# Patient Record
Sex: Male | Born: 1940
Health system: Southern US, Community
[De-identification: ages and names within clinical notes are randomized; demographics above are authoritative.]

## PROBLEM LIST (undated history)

## (undated) DIAGNOSIS — I10 Essential (primary) hypertension: Secondary | ICD-10-CM

## (undated) DIAGNOSIS — E785 Hyperlipidemia, unspecified: Secondary | ICD-10-CM

## (undated) DIAGNOSIS — J449 Chronic obstructive pulmonary disease, unspecified: Secondary | ICD-10-CM

## (undated) DIAGNOSIS — M25569 Pain in unspecified knee: Secondary | ICD-10-CM

## (undated) HISTORY — DX: Chronic obstructive pulmonary disease, unspecified: J44.9

## (undated) HISTORY — DX: Hyperlipidemia, unspecified: E78.5

## (undated) HISTORY — DX: Pain in unspecified knee: M25.569

## (undated) HISTORY — PX: NO PAST SURGERIES: SHX2092

## (undated) HISTORY — DX: Essential (primary) hypertension: I10

---

## 2008-12-15 ENCOUNTER — Emergency Department: Payer: Self-pay | Admitting: Emergency Medicine

## 2011-08-09 ENCOUNTER — Ambulatory Visit: Payer: Self-pay

## 2011-08-09 ENCOUNTER — Emergency Department: Payer: Self-pay | Admitting: Emergency Medicine

## 2014-01-13 DIAGNOSIS — M7580 Other shoulder lesions, unspecified shoulder: Secondary | ICD-10-CM | POA: Insufficient documentation

## 2015-01-03 DIAGNOSIS — I10 Essential (primary) hypertension: Secondary | ICD-10-CM | POA: Insufficient documentation

## 2015-01-03 DIAGNOSIS — M170 Bilateral primary osteoarthritis of knee: Secondary | ICD-10-CM | POA: Insufficient documentation

## 2015-08-22 DIAGNOSIS — E1169 Type 2 diabetes mellitus with other specified complication: Secondary | ICD-10-CM | POA: Insufficient documentation

## 2015-08-22 DIAGNOSIS — N400 Enlarged prostate without lower urinary tract symptoms: Secondary | ICD-10-CM | POA: Insufficient documentation

## 2015-08-22 DIAGNOSIS — I2510 Atherosclerotic heart disease of native coronary artery without angina pectoris: Secondary | ICD-10-CM | POA: Insufficient documentation

## 2019-09-29 LAB — HEMOGLOBIN A1C: Hemoglobin A1C: 6.9

## 2020-02-10 ENCOUNTER — Other Ambulatory Visit: Payer: Self-pay | Admitting: Family Medicine

## 2020-02-10 ENCOUNTER — Ambulatory Visit (INDEPENDENT_AMBULATORY_CARE_PROVIDER_SITE_OTHER): Payer: Medicare Other | Admitting: Family Medicine

## 2020-02-10 ENCOUNTER — Other Ambulatory Visit: Payer: Self-pay

## 2020-02-10 ENCOUNTER — Encounter: Payer: Self-pay | Admitting: Family Medicine

## 2020-02-10 VITALS — BP 121/68 | HR 68 | Temp 96.9°F | Resp 16 | Ht 69.0 in | Wt 137.6 lb

## 2020-02-10 DIAGNOSIS — I10 Essential (primary) hypertension: Secondary | ICD-10-CM

## 2020-02-10 DIAGNOSIS — E785 Hyperlipidemia, unspecified: Secondary | ICD-10-CM

## 2020-02-10 DIAGNOSIS — E1169 Type 2 diabetes mellitus with other specified complication: Secondary | ICD-10-CM | POA: Insufficient documentation

## 2020-02-10 DIAGNOSIS — N400 Enlarged prostate without lower urinary tract symptoms: Secondary | ICD-10-CM

## 2020-02-10 DIAGNOSIS — R351 Nocturia: Secondary | ICD-10-CM

## 2020-02-10 DIAGNOSIS — J432 Centrilobular emphysema: Secondary | ICD-10-CM

## 2020-02-10 NOTE — Patient Instructions (Addendum)
Thank you for coming to the office today.  Call if running low or out of medicine before you come back and I can re order it.  We can discuss other med changes next time. But I am good with your sugars. Keep checking it twice day.  May use Symbicort 2 puff twice a day MORE OFTEN if you like to help with the breathing. If we decide to change, we can try a different inhaler  When you go to [facility] for Diabetic Eye Exam - give them our card and ask them to send [place] a copy of the Diabetic eye report.   DUE for FASTING BLOOD WORK (no food or drink after midnight before the lab appointment, only water or coffee without cream/sugar on the morning of)  SCHEDULE "Lab Only" visit in the morning at the clinic for lab draw in 2 MONTHS   - Make sure Lab Only appointment is at about 1 week before your next appointment, so that results will be available  For Lab Results, once available within 2-3 days of blood draw, you can can log in to MyChart online to view your results and a brief explanation. Also, we can discuss results at next follow-up visit.   Please schedule a Follow-up Appointment to: Return in about 2 months (around 04/11/2020) for 2 month follow-up DM, HTN, COPD, Lab results.  If you have any other questions or concerns, please feel free to call the office or send a message through [place]. You may also schedule an earlier appointment if necessary.  Additionally, you may be receiving a survey about your experience at our office within a few days to 1 week by e-mail or mail. We value your feedback.  Nobie Putnam, DO [place]

## 2020-02-10 NOTE — Progress Notes (Signed)
Subjective:    Patient ID: Stephen Savage, male    DOB: 03-05-1941, 79 y.o.   MRN: [id number]  Stephen Savage is a 79 y.o. male presenting on 02/10/2020 for Establish Care (Diabetes, HTN, COPD)  Here to establish care as new patient.  HPI   Centrilobular Emphysema Tobacco Abuse Chronic problem, active smoker 1ppd >60 years. Not ready to quit. He has some occasional breathing problem dyspnea or cough. Takes Symbicort fairly often but not every day, uses PRn sometimes. Not using albuterol rescue. Not interested in other inhaler currently  CHRONIC HTN: Reports no concerns, has controlled BP Current Meds - Quinapril 40mg  daily, Amlodipine 10mg  daily,    Reports good compliance, took meds today. Tolerating well, w/o complaints. Denies CP, dyspnea, HA, edema, dizziness / lightheadedness  CHRONIC DM, Type 2: Reports no concerns. Previous A1c 6.9 (09/29/19) He is concerned about losing weight further on any other DM medicine prior PCP discussed other med CBGs: Avg 140 in AM and 250 in PM, Low 100, High 300. Checks CBGs twice daily Meds: Metformin XR 500mg  x 2 = 1000mg  BID Reports good compliance. Tolerating well w/o side-effects Currently on ACEi Lifestyle: - Diet (Improved)  - Exercise (Active) Upcoming [facility] DM exam July 2021, they will request record to be sent Denies hypoglycemia, polyuria, visual changes, numbness or tingling.  Bilateral Knee Pain chronic, Osteoarthritis Followed by [facility] for arthritis, knee pain, has had injections cortisone and synvisc type injections as well in past. - He has chronic wear and tear, often working outdoors and often on his knees at times - He takes Tylenol as needed - Not using topical - He functions well overall still   Health Maintenance: UTD PNA and COVID vaccines.  Depression screen PHQ 2/9 02/10/2020  Decreased Interest 0  Down, Depressed, Hopeless 0  PHQ - 2 Score 0    Past Medical History:    Diagnosis Date  . COPD (chronic obstructive pulmonary disease) ([place])   . Hyperlipidemia   . Hypertension   . Knee pain    History reviewed. No pertinent surgical history. Social History   Socioeconomic History  . Marital status: Married    Spouse name: Not on file  . Number of children: Not on file  . Years of education: Not on file  . Highest education level: Not on file  Occupational History  . Not on file  Tobacco Use  . Smoking status: Current Every Day Smoker    Packs/day: 1.00    Years: 60.00    Pack years: 60.00    Types: Cigarettes  . Smokeless tobacco: Never Used  Substance and Sexual Activity  . Alcohol use: Not Currently  . Drug use: Never  . Sexual activity: Not on file  Other Topics Concern  . Not on file  Social History Narrative  . Not on file   Social Determinants of Health   Financial Resource Strain:   . Difficulty of Paying Living Expenses:   Food Insecurity:   . Worried About Charity fundraiser in the Last Year:   . Arboriculturist in the Last Year:   Transportation Needs:   . Film/video editor (Medical):   Marland Kitchen Lack of Transportation (Non-Medical):   Physical Activity:   . Days of Exercise per Week:   . Minutes of Exercise per Session:   Stress:   . Feeling of Stress :   Social Connections:   . Frequency of Communication with Friends and  Family:   . Frequency of Social Gatherings with Friends and Family:   . Attends Religious Services:   . Active Member of Clubs or Organizations:   . Attends Archivist Meetings:   Marland Kitchen Marital Status:   Intimate Partner Violence:   . Fear of Current or Ex-Partner:   . Emotionally Abused:   Marland Kitchen Physically Abused:   . Sexually Abused:    Family History  Problem Relation Age of Onset  . Heart disease Mother 8  . Heart attack Mother   . Diabetes Father 28   Current Outpatient Medications on File Prior to Visit  Medication Sig  . amLODipine (NORVASC) 10 MG tablet Take 10 mg by mouth  daily.  Marland Kitchen aspirin EC 81 MG tablet Take 81 mg by mouth daily. Swallow whole.  . metFORMIN (GLUCOPHAGE) 500 MG tablet Take by mouth 2 (two) times daily with a meal.  . omeprazole (PRILOSEC) 20 MG capsule Take 20 mg by mouth daily.  . quinapril (ACCUPRIL) 40 MG tablet Take 40 mg by mouth daily.  . simvastatin (ZOCOR) 20 MG tablet Take 20 mg by mouth at bedtime.  . SYMBICORT 80-4.5 MCG/ACT inhaler Inhale 2 puffs into the lungs 2 (two) times daily.   No current facility-administered medications on file prior to visit.    Review of Systems Per HPI unless specifically indicated above     Objective:    BP 121/68   Pulse 68   Temp (!) 96.9 F (36.1 C) (Temporal)   Resp 16   Ht 5\' 9"  (1.753 m)   Wt 137 lb 9.6 oz (62.4 kg)   SpO2 96%   BMI 20.32 kg/m   Wt Readings from Last 3 Encounters:  02/10/20 137 lb 9.6 oz (62.4 kg)    Physical Exam Vitals and nursing note reviewed.  Constitutional:      General: He is not in acute distress.    Appearance: He is well-developed. He is not diaphoretic.     Comments: Well-appearing elderly 79 year old, thin, comfortable, cooperative  HENT:     Head: Normocephalic and atraumatic.  Eyes:     General:        Right eye: No discharge.        Left eye: No discharge.     Conjunctiva/sclera: Conjunctivae normal.  Neck:     Thyroid: No thyromegaly.  Cardiovascular:     Rate and Rhythm: Normal rate and regular rhythm.     Heart sounds: Normal heart sounds. No murmur heard.   Pulmonary:     Effort: Pulmonary effort is normal. No respiratory distress.     Breath sounds: Wheezing (mild end exp wheezes scattered) present. No rales.     Comments: Speaks full sentences Musculoskeletal:        General: Normal range of motion.     Cervical back: Normal range of motion and neck supple.  Lymphadenopathy:     Cervical: No cervical adenopathy.  Skin:    General: Skin is warm and dry.     Findings: No erythema or rash.  Neurological:     Mental Status:  He is alert and oriented to person, place, and time.  Psychiatric:        Behavior: Behavior normal.     Comments: Well groomed, good eye contact, normal speech and thoughts      Diabetic Foot Exam - Simple   Simple Foot Form Diabetic Foot exam was performed with the following findings: Yes 02/10/2020 10:42 AM  Visual  Inspection See comments: Yes Sensation Testing Intact to touch and monofilament testing bilaterally: Yes Pulse Check Posterior Tibialis and Dorsalis pulse intact bilaterally: Yes Comments Mild dry skin with some early callus formation heel and forefoot. No ulceration. Intact monofilament.     Results for orders placed or performed in visit on 02/10/20  Hemoglobin A1c  Result Value Ref Range   Hemoglobin A1C 6.9       Assessment & Plan:   Problem List Items Addressed This Visit    Type 2 diabetes mellitus with other specified complication ([place]) - Primary    Well-controlled DM with A1c 6.9 (previously 09/2019) Home readings reviewed overall average seems within 7-8 E2C range Complications - hyperlipidemia, coronary artery disease- increases risk of future cardiovascular complications   Plan:  1. Continue current therapy - metformin XR 500mg  x 2 = 1000mg  BID 2. Encourage improved lifestyle - low carb, low sugar diet, reduce portion size, continue improving regular exercise 3. Check CBG , bring log to next visit for review 4. Continue ASA, ACEi, Statin 5. DM Foot exam done today / Advised to schedule DM ophtho exam, send record - [place] 6. Follow-up 2 months labs  Future consider adjust therapy - may benefit from GLP1 oral or other med, however he concern about weight loss and may lose more weight on those other meds likely no change      Relevant Medications   quinapril (ACCUPRIL) 40 MG tablet   simvastatin (ZOCOR) 20 MG tablet   metFORMIN (GLUCOPHAGE) 500 MG tablet   aspirin EC 81 MG tablet   Essential hypertension    Well-controlled HTN - Home BP  readings  No known complications     Plan:  1. Continue current BP regimen Quinapril 40mg  daily, Amlodipine 10mg  daily 2. Encourage improved lifestyle - low sodium diet, regular exercise 3. Continue monitor BP outside office, bring readings to next visit, if persistently >140/90 or new symptoms notify office sooner      Relevant Medications   quinapril (ACCUPRIL) 40 MG tablet   amLODipine (NORVASC) 10 MG tablet   simvastatin (ZOCOR) 20 MG tablet   aspirin EC 81 MG tablet   Centrilobular emphysema (HCC)    Chronic Emphysema COPD Stable without flare Has some mild persistent breakthrough symptoms, seems suboptimal control on current intermittent use of maintenance Symbicort Reviewed proper dosing 2 puff BID, offer other option if interested possibly reduced frequency dose of other therapy Offer albuterol PRN Tobacco cessation Follow-up if not improving      Relevant Medications   SYMBICORT 80-4.5 MCG/ACT inhaler      Review outside records prior PCP [facility]  No orders of the defined types were placed in this encounter.   Follow up plan: Return in about 2 months (around 04/11/2020) for 2 month follow-up DM, HTN, COPD, Lab results.   Future orders - 2 months full panel A1c, LIPID CBC CMET PSA  Nobie Putnam, DO [facility] 02/10/2020, 10:29 AM

## 2020-02-10 NOTE — Assessment & Plan Note (Addendum)
Well-controlled HTN - Home BP readings  No known complications    Plan:  1. Continue current BP regimen Quinapril 40mg daily, Amlodipine 10mg daily 2. Encourage improved lifestyle - low sodium diet, regular exercise 3. Continue monitor BP outside office, bring readings to next visit, if persistently >140/90 or new symptoms notify office sooner 

## 2020-02-10 NOTE — Assessment & Plan Note (Addendum)
Well-controlled DM with A1c 6.9 (previously 09/2019) Home readings reviewed overall average seems within 7-8 Q2M range Complications - hyperlipidemia, coronary artery disease- increases risk of future cardiovascular complications   Plan:  1. Continue current therapy - metformin XR 500mg  x 2 = 1000mg  BID 2. Encourage improved lifestyle - low carb, low sugar diet, reduce portion size, continue improving regular exercise 3. Check CBG , bring log to next visit for review 4. Continue ASA, ACEi, Statin 5. DM Foot exam done today / Advised to schedule DM ophtho exam, send record - [place] 6. Follow-up 2 months labs  Future consider adjust therapy - may benefit from GLP1 oral or other med, however he concern about weight loss and may lose more weight on those other meds likely no change

## 2020-02-10 NOTE — Assessment & Plan Note (Addendum)
Chronic Emphysema COPD Stable without flare Has some mild persistent breakthrough symptoms, seems suboptimal control on current intermittent use of maintenance Symbicort Reviewed proper dosing 2 puff BID, offer other option if interested possibly reduced frequency dose of other therapy Offer albuterol PRN Tobacco cessation Follow-up if not improving 

## 2020-03-05 ENCOUNTER — Ambulatory Visit
Admission: RE | Admit: 2020-03-05 | Discharge: 2020-03-05 | Disposition: A | Discharge status: Home / Self Care | Payer: Medicare Other | Source: Ambulatory Visit | Attending: Family Medicine | Admitting: Family Medicine

## 2020-03-05 ENCOUNTER — Ambulatory Visit (INDEPENDENT_AMBULATORY_CARE_PROVIDER_SITE_OTHER): Payer: Medicare Other | Admitting: Family Medicine

## 2020-03-05 ENCOUNTER — Other Ambulatory Visit: Payer: Self-pay

## 2020-03-05 ENCOUNTER — Ambulatory Visit
Admission: RE | Admit: 2020-03-05 | Discharge: 2020-03-05 | Disposition: A | Discharge status: Home / Self Care | Payer: Medicare Other | Attending: Family Medicine | Admitting: Family Medicine

## 2020-03-05 ENCOUNTER — Encounter: Payer: Self-pay | Admitting: Family Medicine

## 2020-03-05 VITALS — BP 127/71 | HR 86 | Temp 97.5°F | Resp 16 | Ht 69.0 in | Wt 139.0 lb

## 2020-03-05 DIAGNOSIS — M47817 Spondylosis without myelopathy or radiculopathy, lumbosacral region: Secondary | ICD-10-CM | POA: Diagnosis not present

## 2020-03-05 DIAGNOSIS — M7062 Trochanteric bursitis, left hip: Secondary | ICD-10-CM

## 2020-03-05 DIAGNOSIS — M25552 Pain in left hip: Secondary | ICD-10-CM | POA: Diagnosis not present

## 2020-03-05 DIAGNOSIS — M25559 Pain in unspecified hip: Secondary | ICD-10-CM | POA: Diagnosis not present

## 2020-03-05 DIAGNOSIS — M1612 Unilateral primary osteoarthritis, left hip: Secondary | ICD-10-CM | POA: Diagnosis not present

## 2020-03-05 MED ORDER — PREDNISONE 10 MG PO TABS: ORAL_TABLET | ORAL | 0 refills | Status: DC

## 2020-03-05 NOTE — Progress Notes (Signed)
Subjective:    Patient ID: Stephen Savage, male    DOB: [birth year], 79 y.o.   MRN: [id number]  Stephen Savage is a 79 y.o. male presenting on 03/05/2020 for Hip Pain (left leg --HIP pain onset 3 days gets worst with ROM)  Patient presents for a same day appointment.   HPI   Left Hip Pain New problem over past 3 days, no injury. History of osteoarthritis, multiple joints including knees says woke up with pain and seems not improved. Using occasional tylenol temporary relief. He has no numbness in leg, or back pain, no injury or trauma. Worse if very active and bending and laying on L hip, difficulty at night. Denies numbness tingling weakness, joint pain or swelling.   Depression screen PHQ 2/9 02/10/2020  Decreased Interest 0  Down, Depressed, Hopeless 0  PHQ - 2 Score 0    Social History   Tobacco Use  . Smoking status: Current Every Day Smoker    Packs/day: 1.00    Years: 60.00    Pack years: 60.00    Types: Cigarettes  . Smokeless tobacco: Current User  Substance Use Topics  . Alcohol use: Not Currently  . Drug use: Never    Review of Systems Per HPI unless specifically indicated above     Objective:    BP 127/71   Pulse 86   Temp (!) 97.5 F (36.4 C) (Temporal)   Resp 16   Ht 5\' 9"  (1.753 m)   Wt 139 lb (63 kg)   SpO2 97%   BMI 20.53 kg/m   Wt Readings from Last 3 Encounters:  03/05/20 139 lb (63 kg)  02/10/20 137 lb 9.6 oz (62.4 kg)    Physical Exam Vitals and nursing note reviewed.  Constitutional:      General: He is not in acute distress.    Appearance: He is well-developed. He is not diaphoretic.     Comments: Well-appearing, comfortable, cooperative  HENT:     Head: Normocephalic and atraumatic.  Eyes:     General:        Right eye: No discharge.        Left eye: No discharge.     Conjunctiva/sclera: Conjunctivae normal.  Cardiovascular:     Rate and Rhythm: Normal rate.  Pulmonary:     Effort: Pulmonary effort is normal.    Musculoskeletal:     Comments: Low Back / L hip and Greater Trochanter Inspection: BACK - Normal appearance, no spinal deformity, symmetrical. HIP - Normal appearance, symmetrical, no obvious leg length or pelvis deformity  Palpation: BACK - No tenderness over spinous processes. Bilateral lumbar paraspinal muscles non-tender and without hypertonicity/spasm. Some mild discomfort over R iliac crest region of hip. HIP - Non tender over greater trochanter L hip. Mild tender to L gluteal region low back SI  ROM: BACK - Full active ROM forward flex / back extension, rotation L/R without discomfort HIP - Bilateral hip flex/ext supine normal, internal and external rotation normal without problem or limitation.  Special Testing: BACK - Seated SLR negative for radicular pain bilaterally HIP - FABER FADIR normal and non tender no limited movement. Acetabular compression of hip mild positive pain L  Strength: Bilateral hip flex/ext 5/5, knee flex/ext 5/5, ankle dorsiflex/plantarflex 5/5 Neurovascular: intact distal sensation to light touch   Skin:    General: Skin is warm and dry.     Findings: No erythema or rash.  Neurological:     Mental Status: He is  alert and oriented to person, place, and time.  Psychiatric:        Behavior: Behavior normal.     Comments: Well groomed, good eye contact, normal speech and thoughts    Results for orders placed or performed in visit on 02/10/20  Hemoglobin A1c  Result Value Ref Range   Hemoglobin A1C 6.9       Assessment & Plan:   Problem List Items Addressed This Visit    None    Visit Diagnoses    Acute hip pain, left    -  Primary   Relevant Medications   predniSONE (DELTASONE) 10 MG tablet   Other Relevant Orders   DG HIP UNILAT W OR W/O PELVIS 2-3 VIEWS LEFT   Trochanteric bursitis of left hip       Relevant Medications   predniSONE (DELTASONE) 10 MG tablet   Other Relevant Orders   DG HIP UNILAT W OR W/O PELVIS 2-3 VIEWS LEFT       Acute new problem with L Hip pain, likely trochanteric bursitis given history and exam with point tenderness. Suspect underlying mild osteoarthritis as possible cause without known injury or trauma. H/o OA in other joints. No prior hip problems or imaging. - No radiation of pain or radicular symptoms - Inadequate conservative therapy   Plan: 1. Start Prednisone taper for bursitis 6 day 60 to 10mg  2. No oral NSAID 3. Tylenol regimen 500-1000mg  TID PRN with steroid and after 4. Encouraged use of heating pad 1-2x daily for now then PRN. 5. Follow-up 4-6 weeks if not improving, consider hip x-rays, future troch bursa steroid injection vs Orthopedic  Future consider PT vs Muscle relaxant or other meds if indicated.  Meds ordered this encounter  Medications  . predniSONE (DELTASONE) 10 MG tablet    Sig: Take 6 tabs with breakfast Day 1, 5 tabs Day 2, 4 tabs Day 3, 3 tabs Day 4, 2 tabs Day 5, 1 tab Day 6.    Dispense:  21 tablet    Refill:  0      Follow up plan: Return in about 4 weeks (around 04/02/2020), or if symptoms worsen or fail to improve, for left hip pain.   Nobie Putnam, [facility] 03/05/2020, 1:34 PM

## 2020-03-05 NOTE — Patient Instructions (Addendum)
Thank you for coming to the office today.  1. For your Hip Pain - I think that this is due to Bursitis 2.  - Start Prednisone taper (steroid anti-inflammatory) for nerve irritation with pain in legs. Each pill is 10mg . Take 6 pills (60mg  daily) for 1 day at same time with breakfast, then each day reduce dose by 1 pill, so 5 pills, then 4, then 3, then 2 then 1 (last 6 days). Do not take any Ibuprofen or Aleve while taking the Prednisone.  3. May use Tylenol Extra Str 500mg  tabs - may take 1-2 tablets every 6 hours as needed 5. Recommend to start using heating pad on your lower back 1-2x daily for few weeks  Also try a Wedge Seat Cushion to avoid nerve pinching when sitting prolonged period of time.  This pain may take weeks to months to fully resolve, but hopefully it will respond to the medicine initially. All back injuries (small or serious) are slow to heal since we use our back muscles every day. Be careful with turning, twisting, lifting, sitting / standing for prolonged periods, and avoid re-injury.  If your symptoms significantly worsen with more pain, or new symptoms with weakness in one or both legs, new or different shooting leg pains, numbness in legs or groin, loss of control or retention of urine or bowel movements, please call back for advice and you may need to go directly to the Emergency Department.   Please schedule a Follow-up Appointment to: Return in about 4 weeks (around 04/02/2020), or if symptoms worsen or fail to improve, for left hip pain.  If you have any other questions or concerns, please feel free to call the office or send a message through [place]. You may also schedule an earlier appointment if necessary.  Additionally, you may be receiving a survey about your experience at our office within a few days to 1 week by e-mail or mail. We value your feedback.  Nobie Putnam, DO [place]

## 2020-03-06 ENCOUNTER — Telehealth: Payer: Self-pay

## 2020-03-06 DIAGNOSIS — M160 Bilateral primary osteoarthritis of hip: Secondary | ICD-10-CM

## 2020-03-06 DIAGNOSIS — M25552 Pain in left hip: Secondary | ICD-10-CM

## 2020-03-06 DIAGNOSIS — M7062 Trochanteric bursitis, left hip: Secondary | ICD-10-CM

## 2020-03-06 MED ORDER — BACLOFEN 10 MG PO TABS: 5.0000 mg | ORAL_TABLET | Freq: Three times a day (TID) | ORAL | 1 refills | Status: DC | PRN

## 2020-03-06 NOTE — Telephone Encounter (Signed)
Copied from [place] [id number]. Topic: General - Other >> Mar 06, 2020  8:27 AM Celene Kras wrote: Reason for CRM: Pt called and is requesting to speak with PCP or nurse with his results from his x ray yesterday. Please advise.

## 2020-03-06 NOTE — Telephone Encounter (Signed)
Still do not see results in Epic.  Let me know if you hear back on results. I will check back later.  Nobie Putnam, [facility] 03/06/2020, 12:54 PM

## 2020-03-06 NOTE — Telephone Encounter (Signed)
Patient checking on the status of imaging results. Patient states he's in pain and tylenol is not working, please advise

## 2020-03-06 NOTE — Telephone Encounter (Signed)
Spoke to patient. Still pain, limited relief on prednisone. Reviewed x-ray.  Will add Baclofen PRN muscle relaxant.  Also he wanted referral to orthopedic after discussing further - referral sent to [facility]  He was advised if severe pain or not improving can go to urgent care / ED, since we just saw him for this yesterday, limited other options right now from our office.  Nobie Putnam, [facility] 03/06/2020, 4:49 PM

## 2020-03-06 NOTE — Telephone Encounter (Signed)
Can't see the result notify Caryl Pina will call Nira Conn to get the result in Epic system.

## 2020-03-06 NOTE — Telephone Encounter (Signed)
As per K informed patient about xray result that does not show any fracture. It shows arthritis but patient states, "why I am in so much pain," also Prednisone, Tylenol 500-1000mg  TID PRN and heating pad PRN not improving his Sxs and he is willing to get steroid injection if Dr Raliegh Ip suggest ?

## 2020-03-09 ENCOUNTER — Other Ambulatory Visit: Payer: Self-pay

## 2020-03-09 ENCOUNTER — Emergency Department
Admission: EM | Admit: 2020-03-09 | Discharge: 2020-03-09 | Disposition: A | Discharge status: Home / Self Care | Payer: Medicare Other | Attending: Emergency Medicine | Admitting: Emergency Medicine

## 2020-03-09 ENCOUNTER — Emergency Department: Payer: Medicare Other

## 2020-03-09 ENCOUNTER — Encounter: Payer: Self-pay | Admitting: Emergency Medicine

## 2020-03-09 DIAGNOSIS — Z7982 Long term (current) use of aspirin: Secondary | ICD-10-CM | POA: Diagnosis not present

## 2020-03-09 DIAGNOSIS — E119 Type 2 diabetes mellitus without complications: Secondary | ICD-10-CM | POA: Insufficient documentation

## 2020-03-09 DIAGNOSIS — F17210 Nicotine dependence, cigarettes, uncomplicated: Secondary | ICD-10-CM | POA: Diagnosis not present

## 2020-03-09 DIAGNOSIS — M5442 Lumbago with sciatica, left side: Secondary | ICD-10-CM | POA: Insufficient documentation

## 2020-03-09 DIAGNOSIS — Z79899 Other long term (current) drug therapy: Secondary | ICD-10-CM | POA: Insufficient documentation

## 2020-03-09 DIAGNOSIS — I10 Essential (primary) hypertension: Secondary | ICD-10-CM | POA: Diagnosis not present

## 2020-03-09 DIAGNOSIS — R109 Unspecified abdominal pain: Secondary | ICD-10-CM | POA: Diagnosis not present

## 2020-03-09 DIAGNOSIS — Z7984 Long term (current) use of oral hypoglycemic drugs: Secondary | ICD-10-CM | POA: Insufficient documentation

## 2020-03-09 DIAGNOSIS — Z7952 Long term (current) use of systemic steroids: Secondary | ICD-10-CM | POA: Insufficient documentation

## 2020-03-09 DIAGNOSIS — M545 Low back pain: Secondary | ICD-10-CM | POA: Diagnosis not present

## 2020-03-09 DIAGNOSIS — J449 Chronic obstructive pulmonary disease, unspecified: Secondary | ICD-10-CM | POA: Diagnosis not present

## 2020-03-09 DIAGNOSIS — M25552 Pain in left hip: Secondary | ICD-10-CM | POA: Diagnosis not present

## 2020-03-09 DIAGNOSIS — I2510 Atherosclerotic heart disease of native coronary artery without angina pectoris: Secondary | ICD-10-CM | POA: Insufficient documentation

## 2020-03-09 MED ORDER — HYDROCODONE-ACETAMINOPHEN 5-325 MG PO TABS
1.0000 | ORAL_TABLET | Freq: Once | ORAL | Status: AC
  Administered 2020-03-09: 1 via ORAL
  Filled 2020-03-09: qty 1

## 2020-03-09 MED ORDER — TRAMADOL HCL 50 MG PO TABS: 50.0000 mg | ORAL_TABLET | Freq: Four times a day (QID) | ORAL | 0 refills | Status: DC | PRN

## 2020-03-09 NOTE — ED Provider Notes (Signed)
[facility] Emergency Department Provider Note  ____________________________________________   First MD Initiated Contact with Patient 03/09/20 1048     (approximate)  I have reviewed the triage vital signs and the nursing notes.   HISTORY  Chief Complaint Hip Pain    HPI Stephen Savage is a 79 y.o. male presents to the emergency department complaining of left hip pain and lower back pain that radiates all the way down the left leg.  States pain actually starts at the hip when he stands.  Had an x-ray done by his doctor this week which was negative.  She started him on prednisone for trochanter bursitis.  He denies any fever chills.  No known injury.  Patient is a heavy smoker.    Past Medical History:  Diagnosis Date  . COPD (chronic obstructive pulmonary disease) ([place])   . Hyperlipidemia   . Hypertension   . Knee pain     Patient Active Problem List   Diagnosis Date Noted  . Primary osteoarthritis of both hips 03/06/2020  . Centrilobular emphysema ([place]) 02/10/2020  . Type 2 diabetes mellitus with other specified complication ([place]) 73/41/9379  . Essential hypertension 02/10/2020  . Osteoarthritis of both knees 08/26/2016  . Hyperlipidemia LDL goal <70 08/22/2015  . Coronary artery disease involving native coronary artery without angina pectoris 08/22/2015  . Benign non-nodular prostatic hyperplasia without lower urinary tract symptoms 08/22/2015    History reviewed. No pertinent surgical history.  Prior to Admission medications   Medication Sig Start Date End Date Taking? Authorizing Provider  amLODipine (NORVASC) 10 MG tablet Take 10 mg by mouth daily. 01/24/20   [provider]  aspirin EC 81 MG tablet Take 81 mg by mouth daily. Swallow whole.    [provider]  baclofen (LIORESAL) 10 MG tablet Take 0.5-1 tablets (5-10 mg total) by mouth 3 (three) times daily as needed for muscle spasms. 03/06/20   Karamalegos, Devonne Doughty, DO  metFORMIN (GLUCOPHAGE) 500 MG tablet Take by mouth 2 (two) times daily with a meal.    [provider]  omeprazole (PRILOSEC) 20 MG capsule Take 20 mg by mouth daily. 01/24/20   [provider]  predniSONE (DELTASONE) 10 MG tablet Take 6 tabs with breakfast Day 1, 5 tabs Day 2, 4 tabs Day 3, 3 tabs Day 4, 2 tabs Day 5, 1 tab Day 6. 03/05/20   Karamalegos, Devonne Doughty, DO  quinapril (ACCUPRIL) 40 MG tablet Take 40 mg by mouth daily. 01/24/20   [provider]  simvastatin (ZOCOR) 20 MG tablet Take 20 mg by mouth at bedtime. 01/24/20   [provider]  SYMBICORT 80-4.5 MCG/ACT inhaler Inhale 2 puffs into the lungs 2 (two) times daily. 09/13/19   [provider]  traMADol (ULTRAM) 50 MG tablet Take 1 tablet (50 mg total) by mouth every 6 (six) hours as needed. 03/09/20   Versie Starks, PA-C    Allergies Codeine  Family History  Problem Relation Age of Onset  . Heart disease Mother 1  . Heart attack Mother   . Diabetes Father 24    Social History Social History   Tobacco Use  . Smoking status: Current Every Day Smoker    Packs/day: 1.00    Years: 60.00    Pack years: 60.00    Types: Cigarettes  . Smokeless tobacco: Current User  Substance Use Topics  . Alcohol use: Not Currently  . Drug use: Never    Review of Systems  Constitutional: No fever/chills Eyes: No visual changes. ENT: No sore throat. Respiratory: Denies cough Cardiovascular: Denies chest pain Gastrointestinal: Denies abdominal pain Genitourinary: Negative for dysuria. Musculoskeletal: Positive for left hip and for back pain. Skin: Negative for rash. Psychiatric: no mood changes,     ____________________________________________   PHYSICAL EXAM:  VITAL SIGNS: ED Triage Vitals  Enc Vitals Group     BP 03/09/20 1024 113/81     Pulse Rate 03/09/20 1024 88     Resp 03/09/20 1024 18     Temp 03/09/20 1024 97.7 F (36.5 C)     Temp Source 03/09/20 1024 Oral       SpO2 03/09/20 1024 97 %     Weight 03/09/20 1025 139 lb (63 kg)     Height 03/09/20 1025 5\' 9"  (1.753 m)     Head Circumference --      Peak Flow --      Pain Score 03/09/20 1025 10     Pain Loc --      Pain Edu? --      Excl. in [place]? --     Constitutional: Alert and oriented. Well appearing and in no acute distress. Eyes: Conjunctivae are normal.  Head: Atraumatic. Nose: No congestion/rhinnorhea. Mouth/Throat: Mucous membranes are moist.   Neck:  supple no lymphadenopathy noted Cardiovascular: Normal rate, regular rhythm.  Respiratory: Normal respiratory effort.  No retractions,  Abd: soft nontender bs normal all 4 quad GU: deferred Musculoskeletal: Lumbar spine swelling tender, left hip is tender to palpation, pain is reproduced with movement of the left hip, neurovascular is intact neurologic:  Normal speech and language.  Skin:  Skin is warm, dry and intact. No rash noted. Psychiatric: Mood and affect are normal. Speech and behavior are normal.  ____________________________________________   LABS (all labs ordered are listed, but only abnormal results are displayed)  Labs Reviewed - No data to display ____________________________________________   ____________________________________________  RADIOLOGY  CT of the lumbar spine and pelvis shows degenerative changes in the spine but none of the hips  ____________________________________________   PROCEDURES  Procedure(s) performed: No  Procedures    ____________________________________________   INITIAL IMPRESSION / ASSESSMENT AND PLAN / ED COURSE  Pertinent labs & imaging results that were available during my care of the patient were reviewed by me and considered in my medical decision making (see chart for details).   Patient is an 79 year old male presents emergency department with worsening left hip and lower back pain.  Patient has a history of very heavy smoking.  See HPI  Physical exam shows  patient to be uncomfortable.  Lumbar spine and left hip are mildly tender.  Remainder the exam is unremarkable's time  Did explain findings to the patient.  I feel that due to the amount of smoking along with the amount of pain that we should do a CT at this time.  DDx: Occult fracture, fracture of the left hip, compression fracture in the spine, metastatic disease  CT of the lumbar spine and pelvis ordered  CT shows degenerative changes in the lumbar spine which could account for the left-sided radiation to the leg.  Hips do not show degenerative changes.  Explained findings to the patient.  He had relief of pain with the Vicodin.  He was given a prescription for tramadol.  Follow-up Dr. Marry Guan for his already scheduled appointment.  Return if worsening.  Discharged in stable condition.  He is also to finish his prednisone.     As  part of my medical decision making, I reviewed the following data within the [facility] notes reviewed and incorporated, Old chart reviewed, Radiograph reviewed , Notes from prior ED visits and [place] Controlled Substance Database  ____________________________________________   FINAL CLINICAL IMPRESSION(S) / ED DIAGNOSES  Final diagnoses:  Acute midline low back pain with left-sided sciatica      NEW MEDICATIONS STARTED DURING THIS VISIT:  New Prescriptions   TRAMADOL (ULTRAM) 50 MG TABLET    Take 1 tablet (50 mg total) by mouth every 6 (six) hours as needed.     Note:  This document was prepared using Dragon voice recognition software and may include unintentional dictation errors.    Versie Starks, PA-C 03/09/20 1222    Harvest Dark, MD 03/09/20 1327

## 2020-03-09 NOTE — Discharge Instructions (Addendum)
Follow-up Dr. Marry Guan for your already scheduled appointment.  Follow-up with your regular doctor as needed.  Take pain medication for pain not controlled by Tylenol.  Return to the emergency department if worsening.

## 2020-03-09 NOTE — ED Notes (Signed)
See triage note   Presents with pain to left hip and radiating into left leg  States pain started about 1 week ago   Was seen by PCP   Placed on prednisone felt like it was now working so he stopped taking

## 2020-03-09 NOTE — ED Triage Notes (Addendum)
Pt has had left hip/lower back pain for 4 days. Saw PCP and started on prednisone and helped some but now pain worse again.  Denies known injury.  If still pt reports pain is fine, but walking and if sits wrong pain gets bad.  Pain goes down left leg with ambulation.  Reports had a few seconds of tingling in toes when walking to bathroom and pain got bad.  Has appointment with dr Marry Guan aug 5 but needs something for pain until then.

## 2020-03-12 DIAGNOSIS — M48061 Spinal stenosis, lumbar region without neurogenic claudication: Secondary | ICD-10-CM | POA: Diagnosis not present

## 2020-03-12 DIAGNOSIS — M5442 Lumbago with sciatica, left side: Secondary | ICD-10-CM | POA: Diagnosis not present

## 2020-03-14 DIAGNOSIS — E088 Diabetes mellitus due to underlying condition with unspecified complications: Secondary | ICD-10-CM | POA: Diagnosis not present

## 2020-03-14 DIAGNOSIS — M5442 Lumbago with sciatica, left side: Secondary | ICD-10-CM | POA: Diagnosis not present

## 2020-03-30 DIAGNOSIS — M48061 Spinal stenosis, lumbar region without neurogenic claudication: Secondary | ICD-10-CM | POA: Diagnosis not present

## 2020-03-30 DIAGNOSIS — M5442 Lumbago with sciatica, left side: Secondary | ICD-10-CM | POA: Diagnosis not present

## 2020-04-02 ENCOUNTER — Telehealth: Payer: Self-pay | Admitting: Family Medicine

## 2020-04-04 DIAGNOSIS — E088 Diabetes mellitus due to underlying condition with unspecified complications: Secondary | ICD-10-CM | POA: Diagnosis not present

## 2020-04-04 DIAGNOSIS — M5442 Lumbago with sciatica, left side: Secondary | ICD-10-CM | POA: Diagnosis not present

## 2020-04-05 ENCOUNTER — Other Ambulatory Visit: Payer: Self-pay

## 2020-04-05 ENCOUNTER — Other Ambulatory Visit: Payer: Medicare Other

## 2020-04-05 DIAGNOSIS — I10 Essential (primary) hypertension: Secondary | ICD-10-CM | POA: Diagnosis not present

## 2020-04-05 DIAGNOSIS — E785 Hyperlipidemia, unspecified: Secondary | ICD-10-CM | POA: Diagnosis not present

## 2020-04-05 DIAGNOSIS — E1169 Type 2 diabetes mellitus with other specified complication: Secondary | ICD-10-CM | POA: Diagnosis not present

## 2020-04-05 DIAGNOSIS — J432 Centrilobular emphysema: Secondary | ICD-10-CM

## 2020-04-05 DIAGNOSIS — N400 Enlarged prostate without lower urinary tract symptoms: Secondary | ICD-10-CM

## 2020-04-05 DIAGNOSIS — R351 Nocturia: Secondary | ICD-10-CM

## 2020-04-06 LAB — CBC WITH DIFFERENTIAL/PLATELET
Absolute Monocytes: 515 cells/uL (ref 200–950)
Basophils Absolute: 10 cells/uL (ref 0–200)
Basophils Relative: 0.1 %
Eosinophils Absolute: 0 cells/uL — ABNORMAL LOW (ref 15–500)
Eosinophils Relative: 0 %
HCT: 39.5 % (ref 38.5–50.0)
Hemoglobin: 12.9 g/dL — ABNORMAL LOW (ref 13.2–17.1)
Lymphs Abs: 1089 cells/uL (ref 850–3900)
MCH: 29.5 pg (ref 27.0–33.0)
MCHC: 32.7 g/dL (ref 32.0–36.0)
MCV: 90.2 fL (ref 80.0–100.0)
MPV: 10 fL (ref 7.5–12.5)
Monocytes Relative: 5.2 %
Neutro Abs: 8286 cells/uL — ABNORMAL HIGH (ref 1500–7800)
Neutrophils Relative %: 83.7 %
Platelets: 296 10*3/uL (ref 140–400)
RBC: 4.38 10*6/uL (ref 4.20–5.80)
RDW: 13 % (ref 11.0–15.0)
Total Lymphocyte: 11 %
WBC: 9.9 10*3/uL (ref 3.8–10.8)

## 2020-04-06 LAB — LIPID PANEL
Cholesterol: 131 mg/dL (ref <200)
HDL: 54 mg/dL (ref >=40)
LDL Cholesterol (Calc): 61 mg/dL (calc)
Non-HDL Cholesterol (Calc): 77 mg/dL (calc) (ref <130)
Total CHOL/HDL Ratio: 2.4 (calc) (ref <5.0)
Triglycerides: 82 mg/dL (ref <150)

## 2020-04-06 LAB — COMPLETE METABOLIC PANEL WITH GFR
AG Ratio: 2 (calc) (ref 1.0–2.5)
ALT: 11 U/L (ref 9–46)
AST: 12 U/L (ref 10–35)
Albumin: 4.5 g/dL (ref 3.6–5.1)
Alkaline phosphatase (APISO): 53 U/L (ref 35–144)
BUN: 20 mg/dL (ref 7–25)
CO2: 27 mmol/L (ref 20–32)
Calcium: 9.6 mg/dL (ref 8.6–10.3)
Chloride: 102 mmol/L (ref 98–110)
Creat: 0.89 mg/dL (ref 0.70–1.11)
GFR, Est African American: 93 mL/min/{1.73_m2} (ref >=60)
GFR, Est Non African American: 80 mL/min/{1.73_m2} (ref >=60)
Globulin: 2.2 g/dL (calc) (ref 1.9–3.7)
Glucose, Bld: 179 mg/dL — ABNORMAL HIGH (ref 65–99)
Potassium: 4.5 mmol/L (ref 3.5–5.3)
Sodium: 138 mmol/L (ref 135–146)
Total Bilirubin: 0.6 mg/dL (ref 0.2–1.2)
Total Protein: 6.7 g/dL (ref 6.1–8.1)

## 2020-04-06 LAB — HEMOGLOBIN A1C
Hgb A1c MFr Bld: 7.8 % of total Hgb — ABNORMAL HIGH (ref <5.7)
Mean Plasma Glucose: 177 (calc)
eAG (mmol/L): 9.8 (calc)

## 2020-04-06 LAB — PSA: PSA: 0.2 ng/mL (ref <=4.0)

## 2020-04-10 ENCOUNTER — Ambulatory Visit (INDEPENDENT_AMBULATORY_CARE_PROVIDER_SITE_OTHER): Payer: Medicare Other

## 2020-04-10 VITALS — Ht 69.0 in | Wt 140.0 lb

## 2020-04-10 DIAGNOSIS — Z0000 Encounter for general adult medical examination without abnormal findings: Secondary | ICD-10-CM | POA: Diagnosis not present

## 2020-04-10 NOTE — Patient Instructions (Signed)
Stephen Savage , Thank you for taking time to come for your Medicare Wellness Visit. I appreciate your ongoing commitment to your health goals. Please review the following plan we discussed and let me know if I can assist you in the future.   Screening recommendations/referrals: Colonoscopy: not required Recommended yearly ophthalmology/optometry visit for glaucoma screening and checkup Recommended yearly dental visit for hygiene and checkup  Vaccinations: Influenza vaccine: due Pneumococcal vaccine: completed 08/16/2014 Tdap vaccine: completed 06/11/2011 Shingles vaccine: discussed   Covid-19: 10/28/2019, 09/30/2019  Advanced directives: Advance directive discussed with you today.   Conditions/risks identified: smoking  Next appointment: Follow up in one year for your annual wellness visit.   Preventive Care 81 Years and Older, Male Preventive care refers to lifestyle choices and visits with your health care provider that can promote health and wellness. What does preventive care include?  A yearly physical exam. This is also called an annual well check.  Dental exams once or twice a year.  Routine eye exams. Ask your health care provider how often you should have your eyes checked.  Personal lifestyle choices, including:  Daily care of your teeth and gums.  Regular physical activity.  Eating a healthy diet.  Avoiding tobacco and drug use.  Limiting alcohol use.  Practicing safe sex.  Taking low doses of aspirin every day.  Taking vitamin and mineral supplements as recommended by your health care provider. What happens during an annual well check? The services and screenings done by your health care provider during your annual well check will depend on your age, overall health, lifestyle risk factors, and family history of disease. Counseling  Your health care provider may ask you questions about your:  Alcohol use.  Tobacco use.  Drug use.  Emotional  well-being.  Home and relationship well-being.  Sexual activity.  Eating habits.  History of falls.  Memory and ability to understand (cognition).  Work and work Statistician. Screening  You may have the following tests or measurements:  Height, weight, and BMI.  Blood pressure.  Lipid and cholesterol levels. These may be checked every 5 years, or more frequently if you are over 81 years old.  Skin check.  Lung cancer screening. You may have this screening every year starting at age 81 if you have a 30-pack-year history of smoking and currently smoke or have quit within the past 15 years.  Fecal occult blood test (FOBT) of the stool. You may have this test every year starting at age 81.  Flexible sigmoidoscopy or colonoscopy. You may have a sigmoidoscopy every 5 years or a colonoscopy every 10 years starting at age 81.  Prostate cancer screening. Recommendations will vary depending on your family history and other risks.  Hepatitis C blood test.  Hepatitis B blood test.  Sexually transmitted disease (STD) testing.  Diabetes screening. This is done by checking your blood sugar (glucose) after you have not eaten for a while (fasting). You may have this done every 1-3 years.  Abdominal aortic aneurysm (AAA) screening. You may need this if you are a current or former smoker.  Osteoporosis. You may be screened starting at age 8 if you are at high risk. Talk with your health care provider about your test results, treatment options, and if necessary, the need for more tests. Vaccines  Your health care provider may recommend certain vaccines, such as:  Influenza vaccine. This is recommended every year.  Tetanus, diphtheria, and acellular pertussis (Tdap, Td) vaccine. You may need a Td  booster every 10 years.  Zoster vaccine. You may need this after age 81.  Pneumococcal 13-valent conjugate (PCV13) vaccine. One dose is recommended after age 81.  Pneumococcal  polysaccharide (PPSV23) vaccine. One dose is recommended after age 81. Talk to your health care provider about which screenings and vaccines you need and how often you need them. This information is not intended to replace advice given to you by your health care provider. Make sure you discuss any questions you have with your health care provider. Document Released: 08/31/2015 Document Revised: 04/23/2016 Document Reviewed: 06/05/2015 Elsevier Interactive Patient Education  2017 [place] Prevention in the Home Falls can cause injuries. They can happen to people of all ages. There are many things you can do to make your home safe and to help prevent falls. What can I do on the outside of my home?  Regularly fix the edges of walkways and driveways and fix any cracks.  Remove anything that might make you trip as you walk through a door, such as a raised step or threshold.  Trim any bushes or trees on the path to your home.  Use bright outdoor lighting.  Clear any walking paths of anything that might make someone trip, such as rocks or tools.  Regularly check to see if handrails are loose or broken. Make sure that both sides of any steps have handrails.  Any raised decks and porches should have guardrails on the edges.  Have any leaves, snow, or ice cleared regularly.  Use sand or salt on walking paths during winter.  Clean up any spills in your garage right away. This includes oil or grease spills. What can I do in the bathroom?  Use night lights.  Install grab bars by the toilet and in the tub and shower. Do not use towel bars as grab bars.  Use non-skid mats or decals in the tub or shower.  If you need to sit down in the shower, use a plastic, non-slip stool.  Keep the floor dry. Clean up any water that spills on the floor as soon as it happens.  Remove soap buildup in the tub or shower regularly.  Attach bath mats securely with double-sided non-slip rug  tape.  Do not have throw rugs and other things on the floor that can make you trip. What can I do in the bedroom?  Use night lights.  Make sure that you have a light by your bed that is easy to reach.  Do not use any sheets or blankets that are too big for your bed. They should not hang down onto the floor.  Have a firm chair that has side arms. You can use this for support while you get dressed.  Do not have throw rugs and other things on the floor that can make you trip. What can I do in the kitchen?  Clean up any spills right away.  Avoid walking on wet floors.  Keep items that you use a lot in easy-to-reach places.  If you need to reach something above you, use a strong step stool that has a grab bar.  Keep electrical cords out of the way.  Do not use floor polish or wax that makes floors slippery. If you must use wax, use non-skid floor wax.  Do not have throw rugs and other things on the floor that can make you trip. What can I do with my stairs?  Do not leave any items on the stairs.  Make sure that there are handrails on both sides of the stairs and use them. Fix handrails that are broken or loose. Make sure that handrails are as long as the stairways.  Check any carpeting to make sure that it is firmly attached to the stairs. Fix any carpet that is loose or worn.  Avoid having throw rugs at the top or bottom of the stairs. If you do have throw rugs, attach them to the floor with carpet tape.  Make sure that you have a light switch at the top of the stairs and the bottom of the stairs. If you do not have them, ask someone to add them for you. What else can I do to help prevent falls?  Wear shoes that:  Do not have high heels.  Have rubber bottoms.  Are comfortable and fit you well.  Are closed at the toe. Do not wear sandals.  If you use a stepladder:  Make sure that it is fully opened. Do not climb a closed stepladder.  Make sure that both sides of the  stepladder are locked into place.  Ask someone to hold it for you, if possible.  Clearly mark and make sure that you can see:  Any grab bars or handrails.  First and last steps.  Where the edge of each step is.  Use tools that help you move around (mobility aids) if they are needed. These include:  Canes.  Walkers.  Scooters.  Crutches.  Turn on the lights when you go into a dark area. Replace any light bulbs as soon as they burn out.  Set up your furniture so you have a clear path. Avoid moving your furniture around.  If any of your floors are uneven, fix them.  If there are any pets around you, be aware of where they are.  Review your medicines with your doctor. Some medicines can make you feel dizzy. This can increase your chance of falling. Ask your doctor what other things that you can do to help prevent falls. This information is not intended to replace advice given to you by your health care provider. Make sure you discuss any questions you have with your health care provider. Document Released: 05/31/2009 Document Revised: 01/10/2016 Document Reviewed: 09/08/2014 Elsevier Interactive Patient Education  2017 [employer].

## 2020-04-10 NOTE — Progress Notes (Signed)
I connected with Stephen Savage today by telephone and verified that I am speaking with the correct person using two identifiers. Location patient: home Location provider: work Persons participating in the virtual visit: Stephen Savage, Glenna Durand LPN.   I discussed the limitations, risks, security and privacy concerns of performing an evaluation and management service by telephone and the availability of in person appointments. I also discussed with the patient that there may be a patient responsible charge related to this service. The patient expressed understanding and verbally consented to this telephonic visit.    Interactive audio and video telecommunications were attempted between this provider and patient, however failed, due to patient having technical difficulties OR patient did not have access to video capability.  We continued and completed visit with audio only.    Vital signs may be patient reported or missing.   Subjective:   Stephen Savage is a 79 y.o. male who presents for Medicare Annual/Subsequent preventive examination.  Review of Systems     Cardiac Risk Factors include: advanced age (>88men, >66 women);diabetes mellitus;dyslipidemia;hypertension;male gender;smoking/ tobacco exposure     Objective:    Today's Vitals   04/10/20 0857  Weight: 140 lb (63.5 kg)  Height: 5\' 9"  (1.753 m)   Body mass index is 20.67 kg/m.  Advanced Directives 04/10/2020 03/09/2020  Does Patient Have a Medical Advance Directive? No No  Would patient like information on creating a medical advance directive? - No - Patient declined    Current Medications (verified) Outpatient Encounter Medications as of 04/10/2020  Medication Sig  . amLODipine (NORVASC) 10 MG tablet Take 10 mg by mouth daily.  Marland Kitchen aspirin EC 81 MG tablet Take 81 mg by mouth daily. Swallow whole.  . metFORMIN (GLUCOPHAGE) 500 MG tablet Take by mouth 2 (two) times daily with a meal.  . omeprazole (PRILOSEC) 20  MG capsule Take 20 mg by mouth daily.  . quinapril (ACCUPRIL) 40 MG tablet Take 40 mg by mouth daily.  . simvastatin (ZOCOR) 20 MG tablet Take 20 mg by mouth at bedtime.  . SYMBICORT 80-4.5 MCG/ACT inhaler Inhale 2 puffs into the lungs 2 (two) times daily.  . traMADol (ULTRAM) 50 MG tablet Take 1 tablet (50 mg total) by mouth every 6 (six) hours as needed.  . baclofen (LIORESAL) 10 MG tablet Take 0.5-1 tablets (5-10 mg total) by mouth 3 (three) times daily as needed for muscle spasms. (Patient not taking: Reported on 04/10/2020)  . predniSONE (DELTASONE) 10 MG tablet Take 6 tabs with breakfast Day 1, 5 tabs Day 2, 4 tabs Day 3, 3 tabs Day 4, 2 tabs Day 5, 1 tab Day 6. (Patient not taking: Reported on 04/10/2020)   No facility-administered encounter medications on file as of 04/10/2020.    Allergies (verified) Codeine   History: Past Medical History:  Diagnosis Date  . COPD (chronic obstructive pulmonary disease) ([place])   . Hyperlipidemia   . Hypertension   . Knee pain    History reviewed. No pertinent surgical history. Family History  Problem Relation Age of Onset  . Heart disease Mother 4  . Heart attack Mother   . Diabetes Father 13   Social History   Socioeconomic History  . Marital status: Married    Spouse name: Not on file  . Number of children: Not on file  . Years of education: Not on file  . Highest education level: Not on file  Occupational History  . Occupation: retired  Tobacco Use  . Smoking status:  Current Every Day Smoker    Packs/day: 1.00    Years: 60.00    Pack years: 60.00    Types: Cigarettes  . Smokeless tobacco: Current User  Vaping Use  . Vaping Use: Former  Substance and Sexual Activity  . Alcohol use: Not Currently  . Drug use: Never  . Sexual activity: Not on file  Other Topics Concern  . Not on file  Social History Narrative  . Not on file   Social Determinants of Health   Financial Resource Strain: Low Risk   . Difficulty of Paying  Living Expenses: Not hard at all  Food Insecurity: No Food Insecurity  . Worried About Charity fundraiser in the Last Year: Never true  . Ran Out of Food in the Last Year: Never true  Transportation Needs: No Transportation Needs  . Lack of Transportation (Medical): No  . Lack of Transportation (Non-Medical): No  Physical Activity: Inactive  . Days of Exercise per Week: 0 days  . Minutes of Exercise per Session: 0 min  Stress: No Stress Concern Present  . Feeling of Stress : Not at all  Social Connections:   . Frequency of Communication with Friends and Family: Not on file  . Frequency of Social Gatherings with Friends and Family: Not on file  . Attends Religious Services: Not on file  . Active Member of Clubs or Organizations: Not on file  . Attends Archivist Meetings: Not on file  . Marital Status: Not on file    Tobacco Counseling Ready to quit: Yes Counseling given: Not Answered   Clinical Intake:  Pre-visit preparation completed: Yes  Pain : No/denies pain     Nutritional Status: BMI of 19-24  Normal Nutritional Risks: None Diabetes: Yes  How often do you need to have someone help you when you read instructions, pamphlets, or other written materials from your doctor or pharmacy?: 1 - Never What is the last grade level you completed in school?: 7th grade  Diabetic? Yes Nutrition Risk Assessment:  Has the patient had any N/V/D within the last 2 months?  No  Does the patient have any non-healing wounds?  No  Has the patient had any unintentional weight loss or weight gain?  No   Diabetes:  Is the patient diabetic?  Yes  If diabetic, was a CBG obtained today?  No  Did the patient bring in their glucometer from home?  No  How often do you monitor your CBG's? Twice daily.   Financial Strains and Diabetes Management:  Are you having any financial strains with the device, your supplies or your medication? No .  Does the patient want to be seen by  Chronic Care Management for management of their diabetes?  No  Would the patient like to be referred to a Nutritionist or for Diabetic Management?  No   Diabetic Exams:  Diabetic Eye Exam: Overdue for diabetic eye exam. Pt has been advised about the importance in completing this exam. Patient advised to call and schedule an eye exam. Diabetic Foot Exam: Completed 02/10/2020   Interpreter Needed?: No  Information entered by :: NAllen LPN   Activities of Daily Living In your present state of health, do you have any difficulty performing the following activities: 04/10/2020 02/10/2020  Hearing? N N  Vision? N N  Difficulty concentrating or making decisions? N N  Walking or climbing stairs? Y N  Comment knee troubles -  Dressing or bathing? N N  Doing errands,  shopping? N N  Preparing Food and eating ? N -  Using the Toilet? N -  In the past six months, have you accidently leaked urine? N -  Do you have problems with loss of bowel control? N -  Managing your Medications? N -  Managing your Finances? N -  Housekeeping or managing your Housekeeping? N -  Some recent data might be hidden    Patient Care Team: Olin Hauser, DO as PCP - General (Family Medicine)  Indicate any recent Medical Services you may have received from other than [facility] providers in the past year (date may be approximate).     Assessment:   This is a routine wellness examination for [place].  Hearing/Vision screen  Hearing Screening   125Hz  250Hz  500Hz  1000Hz  2000Hz  3000Hz  4000Hz  6000Hz  8000Hz   Right ear:           Left ear:           Vision Screening Comments: Regular eye exams, [facility]  Dietary issues and exercise activities discussed: Current Exercise Habits: The patient does not participate in regular exercise at present  Goals    . Patient Stated     04/10/2020, no goals      Depression Screen PHQ 2/9 Scores 04/10/2020 02/10/2020  PHQ - 2 Score 0 0    Fall Risk Fall  Risk  04/10/2020 02/10/2020  Falls in the past year? 0 0  Number falls in past yr: - 0  Injury with Fall? - 0  Risk for fall due to : Medication side effect -  Follow up Falls evaluation completed;Education provided;Falls prevention discussed Falls evaluation completed    Any stairs in or around the home? Yes  If so, are there any without handrails? No  Home free of loose throw rugs in walkways, pet beds, electrical cords, etc? Yes  Adequate lighting in your home to reduce risk of falls? Yes   ASSISTIVE DEVICES UTILIZED TO PREVENT FALLS:  Life alert? No  Use of a cane, walker or w/c? No  Grab bars in the bathroom? No  Shower chair or bench in shower? Yes  Elevated toilet seat or a handicapped toilet? Yes   TIMED UP AND GO:  Was the test performed? No .     Cognitive Function:     6CIT Screen 04/10/2020  What Year? 0 points  What month? 0 points  What time? 0 points  Count back from 20 2 points  Months in reverse 2 points  Repeat phrase 4 points  Total Score 8    Immunizations Immunization History  Administered Date(s) Administered  . Influenza, High Dose Seasonal PF 05/23/2014, 04/19/2015, 05/20/2018, 05/24/2019  . Influenza-Unspecified 05/14/2017  . Moderna SARS-COVID-2 Vaccination 09/30/2019, 10/28/2019  . Pneumococcal Conjugate-13 08/16/2014  . Pneumococcal Polysaccharide-23 07/03/2011  . Tdap 06/11/2011    TDAP status: Up to date Flu Vaccine status: Up to date Pneumococcal vaccine status: Up to date Covid-19 vaccine status: Completed vaccines  Qualifies for Shingles Vaccine? Yes   Zostavax completed No   Shingrix Completed?: No.    Education has been provided regarding the importance of this vaccine. Patient has been advised to call insurance company to determine out of pocket expense if they have not yet received this vaccine. Advised may also receive vaccine at local pharmacy or Health Dept. Verbalized acceptance and understanding.  Screening  Tests Health Maintenance  Topic Date Due  . OPHTHALMOLOGY EXAM  Never done  . INFLUENZA VACCINE  03/18/2020  .  HEMOGLOBIN A1C  10/06/2020  . FOOT EXAM  02/09/2021  . TETANUS/TDAP  06/10/2021  . COVID-19 Vaccine  Completed  . PNA vac Low Risk Adult  Completed    Health Maintenance  Health Maintenance Due  Topic Date Due  . OPHTHALMOLOGY EXAM  Never done  . INFLUENZA VACCINE  03/18/2020    Colorectal cancer screening: No longer required.   Lung Cancer Screening: (Low Dose CT Chest recommended if Age 18-80 years, 30 pack-year currently smoking OR have quit w/in 15years.) does not qualify.   Lung Cancer Screening Referral: no  Additional Screening:  Hepatitis C Screening: does not qualify;   Vision Screening: Recommended annual ophthalmology exams for early detection of glaucoma and other disorders of the eye. Is the patient up to date with their annual eye exam?  No  Who is the provider or what is the name of the office in which the patient attends annual eye exams? [facility] If pt is not established with a provider, would they like to be referred to a provider to establish care? No .   Dental Screening: Recommended annual dental exams for proper oral hygiene  Community Resource Referral / Chronic Care Management: CRR required this visit?  No   CCM required this visit?  No      Plan:     I have personally reviewed and noted the following in the patient's chart:   . Medical and social history . Use of alcohol, tobacco or illicit drugs  . Current medications and supplements . Functional ability and status . Nutritional status . Physical activity . Advanced directives . List of other physicians . Hospitalizations, surgeries, and ER visits in previous 12 months . Vitals . Screenings to include cognitive, depression, and falls . Referrals and appointments  In addition, I have reviewed and discussed with patient certain preventive protocols, quality  metrics, and best practice recommendations. A written personalized care plan for preventive services as well as general preventive health recommendations were provided to patient.     Kellie Simmering, LPN   01/17/931   Nurse Notes:

## 2020-04-11 ENCOUNTER — Ambulatory Visit: Payer: Medicare Other | Admitting: Family Medicine

## 2020-04-11 ENCOUNTER — Other Ambulatory Visit: Payer: Self-pay

## 2020-04-17 DIAGNOSIS — E119 Type 2 diabetes mellitus without complications: Secondary | ICD-10-CM | POA: Diagnosis not present

## 2020-04-17 DIAGNOSIS — H35372 Puckering of macula, left eye: Secondary | ICD-10-CM | POA: Diagnosis not present

## 2020-04-17 LAB — HM DIABETES EYE EXAM

## 2020-04-18 DIAGNOSIS — M48061 Spinal stenosis, lumbar region without neurogenic claudication: Secondary | ICD-10-CM | POA: Diagnosis not present

## 2020-04-18 DIAGNOSIS — M5442 Lumbago with sciatica, left side: Secondary | ICD-10-CM | POA: Diagnosis not present

## 2020-04-20 ENCOUNTER — Ambulatory Visit (INDEPENDENT_AMBULATORY_CARE_PROVIDER_SITE_OTHER): Payer: Medicare Other | Admitting: Family Medicine

## 2020-04-20 ENCOUNTER — Encounter: Payer: Self-pay | Admitting: Family Medicine

## 2020-04-20 ENCOUNTER — Other Ambulatory Visit: Payer: Self-pay

## 2020-04-20 VITALS — BP 135/75 | HR 76 | Temp 97.3°F | Resp 16 | Ht 69.0 in | Wt 137.0 lb

## 2020-04-20 DIAGNOSIS — J432 Centrilobular emphysema: Secondary | ICD-10-CM

## 2020-04-20 DIAGNOSIS — E785 Hyperlipidemia, unspecified: Secondary | ICD-10-CM

## 2020-04-20 DIAGNOSIS — E1169 Type 2 diabetes mellitus with other specified complication: Secondary | ICD-10-CM | POA: Diagnosis not present

## 2020-04-20 DIAGNOSIS — J418 Mixed simple and mucopurulent chronic bronchitis: Secondary | ICD-10-CM | POA: Diagnosis not present

## 2020-04-20 DIAGNOSIS — K219 Gastro-esophageal reflux disease without esophagitis: Secondary | ICD-10-CM

## 2020-04-20 DIAGNOSIS — I10 Essential (primary) hypertension: Secondary | ICD-10-CM

## 2020-04-20 DIAGNOSIS — N400 Enlarged prostate without lower urinary tract symptoms: Secondary | ICD-10-CM

## 2020-04-20 MED ORDER — SIMVASTATIN 20 MG PO TABS: 20.0000 mg | ORAL_TABLET | Freq: Every day | ORAL | 3 refills | Status: DC

## 2020-04-20 MED ORDER — METFORMIN HCL ER 500 MG PO TB24: 1000.0000 mg | ORAL_TABLET | Freq: Two times a day (BID) | ORAL | 3 refills | Status: DC

## 2020-04-20 MED ORDER — QUINAPRIL HCL 40 MG PO TABS: 40.0000 mg | ORAL_TABLET | Freq: Every day | ORAL | 3 refills | Status: DC

## 2020-04-20 MED ORDER — OMEPRAZOLE 20 MG PO CPDR: 20.0000 mg | DELAYED_RELEASE_CAPSULE | Freq: Every day | ORAL | 3 refills | Status: DC

## 2020-04-20 MED ORDER — METFORMIN HCL ER 500 MG PO TB24: 1000.0000 mg | ORAL_TABLET | Freq: Every day | ORAL | 3 refills | Status: DC

## 2020-04-20 MED ORDER — AMLODIPINE BESYLATE 10 MG PO TABS: 10.0000 mg | ORAL_TABLET | Freq: Every day | ORAL | 3 refills | Status: DC

## 2020-04-20 NOTE — Progress Notes (Signed)
Subjective:    Patient ID: Stephen Savage, male    DOB: 03-13-1941, 79 y.o.   MRN: [id number]  LUKASZ ROGUS is a 79 y.o. male presenting on 04/20/2020 for Diabetes   HPI   Centrilobular Emphysema Tobacco Abuse Chronic problem, active smoker 1ppd >60 years. Not ready to quit. He has some occasional breathing problem dyspnea or cough. Takes Symbicort fairly often but not every day, uses PRn sometimes. Not using albuterol rescue. Not interested in other inhaler currently  CHRONIC HTN: Reports no concerns, has controlled BP Current Meds - Quinapril 40mg  daily, Amlodipine 10mg  daily,    Reports good compliance, took meds today. Tolerating well, w/o complaints. Denies CP, dyspnea, HA, edema, dizziness / lightheadedness  CHRONIC DM, Type 2: Hyperlipidemia  No concerns Last A1c on lab A1c 7.8, prior 6 range. CBGs: Avg 130-140 in AM and 200-250 after dinner PM. Checks CBGs twice daily Meds: Metformin XR 500mg  x 2 = 1000mg  BID - needs refill Reports good compliance. Tolerating well w/o side-effects Currently on ACEi Controlled on Statin Lifestyle: - Diet (Improved)  - Exercise (Active) DM Eye Exam [facility] 04/17/20 Denies hypoglycemia, polyuria, visual changes, numbness or tingling.  Bilateral Knee Pain chronic, Osteoarthritis Back Pain, lumbar disc Followed by [facility] Taking Tylenol  Health Maintenance: Prostate CA Screening: Prior PSA / DRE reported normal. Last PSA 0.2 (03/2020). Currently BPH. No known family history of prostate CA.   UTD PNA, COVID vaccines   Depression screen [facility] 2/9 04/20/2020 04/10/2020 02/10/2020  Decreased Interest 0 0 0  Down, Depressed, Hopeless 0 0 0  PHQ - 2 Score 0 0 0    Past Medical History:  Diagnosis Date  . COPD (chronic obstructive pulmonary disease) ([place])   . Hyperlipidemia   . Hypertension   . Knee pain    History reviewed. No pertinent surgical history. Social History   Socioeconomic History  .  Marital status: Married    Spouse name: Not on file  . Number of children: Not on file  . Years of education: Not on file  . Highest education level: Not on file  Occupational History  . Occupation: retired  Tobacco Use  . Smoking status: Current Every Day Smoker    Packs/day: 1.00    Years: 60.00    Pack years: 60.00    Types: Cigarettes  . Smokeless tobacco: Current User  Vaping Use  . Vaping Use: Former  Substance and Sexual Activity  . Alcohol use: Not Currently  . Drug use: Never  . Sexual activity: Not on file  Other Topics Concern  . Not on file  Social History Narrative  . Not on file   Social Determinants of Health   Financial Resource Strain: Low Risk   . Difficulty of Paying Living Expenses: Not hard at all  Food Insecurity: No Food Insecurity  . Worried About Charity fundraiser in the Last Year: Never true  . Ran Out of Food in the Last Year: Never true  Transportation Needs: No Transportation Needs  . Lack of Transportation (Medical): No  . Lack of Transportation (Non-Medical): No  Physical Activity: Inactive  . Days of Exercise per Week: 0 days  . Minutes of Exercise per Session: 0 min  Stress: No Stress Concern Present  . Feeling of Stress : Not at all  Social Connections:   . Frequency of Communication with Friends and Family: Not on file  . Frequency of Social Gatherings with Friends and Family: Not  on file  . Attends Religious Services: Not on file  . Active Member of Clubs or Organizations: Not on file  . Attends Archivist Meetings: Not on file  . Marital Status: Not on file  Intimate Partner Violence:   . Fear of Current or Ex-Partner: Not on file  . Emotionally Abused: Not on file  . Physically Abused: Not on file  . Sexually Abused: Not on file   Family History  Problem Relation Age of Onset  . Heart disease Mother 69  . Heart attack Mother   . Diabetes Father 53   Current Outpatient Medications on File Prior to Visit    Medication Sig  . aspirin EC 81 MG tablet Take 81 mg by mouth daily. Swallow whole.  . baclofen (LIORESAL) 10 MG tablet Take 0.5-1 tablets (5-10 mg total) by mouth 3 (three) times daily as needed for muscle spasms.  . SYMBICORT 80-4.5 MCG/ACT inhaler Inhale 2 puffs into the lungs 2 (two) times daily.   No current facility-administered medications on file prior to visit.    Review of Systems Per HPI unless specifically indicated above      Objective:    BP 135/75   Pulse 76   Temp (!) 97.3 F (36.3 C) (Temporal)   Resp 16   Ht 5\' 9"  (1.753 m)   Wt 137 lb (62.1 kg)   SpO2 97%   BMI 20.23 kg/m   Wt Readings from Last 3 Encounters:  04/20/20 137 lb (62.1 kg)  04/10/20 140 lb (63.5 kg)  03/09/20 139 lb (63 kg)    Physical Exam Vitals and nursing note reviewed.  Constitutional:      General: He is not in acute distress.    Appearance: He is well-developed. He is not diaphoretic.     Comments: Well-appearing, comfortable, cooperative  HENT:     Head: Normocephalic and atraumatic.  Eyes:     General:        Right eye: No discharge.        Left eye: No discharge.     Conjunctiva/sclera: Conjunctivae normal.  Neck:     Thyroid: No thyromegaly.  Cardiovascular:     Rate and Rhythm: Normal rate and regular rhythm.     Heart sounds: Normal heart sounds. No murmur heard.   Pulmonary:     Effort: Pulmonary effort is normal. No respiratory distress.     Breath sounds: Normal breath sounds. No wheezing or rales.  Musculoskeletal:        General: Normal range of motion.     Cervical back: Normal range of motion and neck supple.  Lymphadenopathy:     Cervical: No cervical adenopathy.  Skin:    General: Skin is warm and dry.     Findings: No erythema or rash.  Neurological:     Mental Status: He is alert and oriented to person, place, and time.  Psychiatric:        Behavior: Behavior normal.     Comments: Well groomed, good eye contact, normal speech and thoughts         Results for orders placed or performed in visit on 04/05/20  PSA  Result Value Ref Range   PSA 0.2 < OR = 4.0 ng/mL  Lipid panel  Result Value Ref Range   Cholesterol 131 <200 mg/dL   HDL 54 > OR = 40 mg/dL   Triglycerides 82 <150 mg/dL   LDL Cholesterol (Calc) 61 mg/dL (calc)   Total CHOL/HDL Ratio  2.4 <5.0 (calc)   Non-HDL Cholesterol (Calc) 77 <130 mg/dL (calc)  COMPLETE METABOLIC PANEL WITH GFR  Result Value Ref Range   Glucose, Bld 179 (H) 65 - 99 mg/dL   BUN 20 7 - 25 mg/dL   Creat 0.89 0.70 - 1.11 mg/dL   GFR, Est Non African American 80 > OR = 60 mL/min/1.27m2   GFR, Est African American 93 > OR = 60 mL/min/1.68m2   BUN/Creatinine Ratio NOT APPLICABLE 6 - 22 (calc)   Sodium 138 135 - 146 mmol/L   Potassium 4.5 3.5 - 5.3 mmol/L   Chloride 102 98 - 110 mmol/L   CO2 27 20 - 32 mmol/L   Calcium 9.6 8.6 - 10.3 mg/dL   Total Protein 6.7 6.1 - 8.1 g/dL   Albumin 4.5 3.6 - 5.1 g/dL   Globulin 2.2 1.9 - 3.7 g/dL (calc)   AG Ratio 2.0 1.0 - 2.5 (calc)   Total Bilirubin 0.6 0.2 - 1.2 mg/dL   Alkaline phosphatase (APISO) 53 35 - 144 U/L   AST 12 10 - 35 U/L   ALT 11 9 - 46 U/L  CBC with Differential/Platelet  Result Value Ref Range   WBC 9.9 3.8 - 10.8 Thousand/uL   RBC 4.38 4.20 - 5.80 Million/uL   Hemoglobin 12.9 (L) 13.2 - 17.1 g/dL   HCT 39.5 38 - 50 %   MCV 90.2 80.0 - 100.0 fL   MCH 29.5 27.0 - 33.0 pg   MCHC 32.7 32.0 - 36.0 g/dL   RDW 13.0 11.0 - 15.0 %   Platelets 296 140 - 400 Thousand/uL   MPV 10.0 7.5 - 12.5 fL   Neutro Abs 8,286 (H) 1,500 - 7,800 cells/uL   Lymphs Abs 1,089 850 - 3,900 cells/uL   Absolute Monocytes 515 200 - 950 cells/uL   Eosinophils Absolute 0 (L) 15 - 500 cells/uL   Basophils Absolute 10 0 - 200 cells/uL   Neutrophils Relative % 83.7 %   Total Lymphocyte 11.0 %   Monocytes Relative 5.2 %   Eosinophils Relative 0.0 %   Basophils Relative 0.1 %  Hemoglobin A1c  Result Value Ref Range   Hgb A1c MFr Bld 7.8 (H) <5.7 % of total  Hgb   Mean Plasma Glucose 177 (calc)   eAG (mmol/L) 9.8 (calc)      Assessment & Plan:   Problem List Items Addressed This Visit    Type 2 diabetes mellitus with other specified complication (HCC) - Primary    Elevated A1c to 7.8, prior controlled, <7 Home readings reviewed overall average seems within 7-8 V6H range Complications - hyperlipidemia, coronary artery disease- increases risk of future cardiovascular complications   Plan:  1. Continue current therapy - metformin XR 500mg  x 2 = 1000mg  BID - re order 2. Encourage improved lifestyle - low carb, low sugar diet, reduce portion size, continue improving regular exercise 3. Check CBG , bring log to next visit for review 4. Continue ASA, ACEi, Statin 5. UTD DM Foot, Ophtho      Relevant Medications   simvastatin (ZOCOR) 20 MG tablet   quinapril (ACCUPRIL) 40 MG tablet   metFORMIN (GLUCOPHAGE-XR) 500 MG 24 hr tablet   Hyperlipidemia associated with type 2 diabetes mellitus ([place])    Controlled cholesterol on statin and lifestyle Last lipid panel 03/2020  Plan: 1. Continue current meds - Simvastatin 20mg  daily 2. Encourage improved lifestyle - low carb/cholesterol, reduce portion size, continue improving regular exercise      Relevant  Medications   simvastatin (ZOCOR) 20 MG tablet   quinapril (ACCUPRIL) 40 MG tablet   metFORMIN (GLUCOPHAGE-XR) 500 MG 24 hr tablet   Essential hypertension    Well-controlled HTN - Home BP readings  No known complications     Plan:  1. Continue current BP regimen Quinapril 40mg  daily, Amlodipine 10mg  daily - refill 2. Encourage improved lifestyle - low sodium diet, regular exercise 3. Continue monitor BP outside office, bring readings to next visit, if persistently >140/90 or new symptoms notify office sooner      Relevant Medications   simvastatin (ZOCOR) 20 MG tablet   quinapril (ACCUPRIL) 40 MG tablet   amLODipine (NORVASC) 10 MG tablet   Centrilobular emphysema (HCC)    Chronic  Emphysema COPD Stable without flare Has some mild persistent breakthrough symptoms, seems suboptimal control on current intermittent use of maintenance Symbicort Reviewed proper dosing 2 puff BID, offer other option if interested possibly reduced frequency dose of other therapy Offer albuterol PRN Tobacco cessation Follow-up if not improving      Benign non-nodular prostatic hyperplasia without lower urinary tract symptoms    Stable chronic BPH       Other Visit Diagnoses    Mixed simple and mucopurulent chronic bronchitis (HCC)       Gastroesophageal reflux disease, unspecified whether esophagitis present       Relevant Medications   omeprazole (PRILOSEC) 20 MG capsule      Meds ordered this encounter  Medications  . DISCONTD: metFORMIN (GLUCOPHAGE-XR) 500 MG 24 hr tablet    Sig: Take 2 tablets (1,000 mg total) by mouth daily with breakfast.    Dispense:  360 tablet    Refill:  3  . simvastatin (ZOCOR) 20 MG tablet    Sig: Take 1 tablet (20 mg total) by mouth at bedtime.    Dispense:  90 tablet    Refill:  3  . quinapril (ACCUPRIL) 40 MG tablet    Sig: Take 1 tablet (40 mg total) by mouth daily.    Dispense:  90 tablet    Refill:  3  . omeprazole (PRILOSEC) 20 MG capsule    Sig: Take 1 capsule (20 mg total) by mouth daily before breakfast.    Dispense:  90 capsule    Refill:  3  . amLODipine (NORVASC) 10 MG tablet    Sig: Take 1 tablet (10 mg total) by mouth daily.    Dispense:  90 tablet    Refill:  3  . metFORMIN (GLUCOPHAGE-XR) 500 MG 24 hr tablet    Sig: Take 2 tablets (1,000 mg total) by mouth 2 (two) times daily with a meal.    Dispense:  360 tablet    Refill:  3    Sorry! Correction - 2 tablets twice daily should be correct dosing, 90 day    Follow up plan: Return in about 5 months (around 09/20/2020) for 5 month follow-up DM A1c.  Nobie Putnam, DO [facility] 04/20/2020, 9:15 AM

## 2020-04-20 NOTE — Assessment & Plan Note (Signed)
Chronic problem Controlled On Symbicort 2 puff BID

## 2020-04-20 NOTE — Assessment & Plan Note (Signed)
Well-controlled HTN - Home BP readings  No known complications     Plan:  1. Continue current BP regimen Quinapril 40mg  daily, Amlodipine 10mg  daily - refill 2. Encourage improved lifestyle - low sodium diet, regular exercise 3. Continue monitor BP outside office, bring readings to next visit, if persistently >140/90 or new symptoms notify office sooner

## 2020-04-20 NOTE — Assessment & Plan Note (Signed)
Elevated A1c to 7.8, prior controlled, <7 Home readings reviewed overall average seems within 7-8 B2I range Complications - hyperlipidemia, coronary artery disease- increases risk of future cardiovascular complications   Plan:  1. Continue current therapy - metformin XR 500mg  x 2 = 1000mg  BID - re order 2. Encourage improved lifestyle - low carb, low sugar diet, reduce portion size, continue improving regular exercise 3. Check CBG , bring log to next visit for review 4. Continue ASA, ACEi, Statin 5. UTD DM Foot, Ophtho

## 2020-04-20 NOTE — Assessment & Plan Note (Signed)
Stable chronic BPH

## 2020-04-20 NOTE — Patient Instructions (Addendum)
Thank you for coming to the office today.  We will refill all meds within the week. Let me know if any issues.  Keep up the good work. Goal to keep improving sugar.   Recent Labs    09/29/19 0000 04/05/20 0842  HGBA1C 6.9 7.8*   Recommend to start taking Tylenol Extra Strength 500mg  tabs - take 1 to 2 tabs per dose (max 1000mg ) every 6-8 hours for pain (take regularly, don't skip a dose for next 7 days), max 24 hour daily dose is 6 tablets or 3000mg . In the future you can repeat the same everyday Tylenol course for 1-2 weeks at a time.    Please schedule a Follow-up Appointment to: Return in about 5 months (around 09/20/2020) for 5 month follow-up DM A1c.  If you have any other questions or concerns, please feel free to call the office or send a message through [place]. You may also schedule an earlier appointment if necessary.  Additionally, you may be receiving a survey about your experience at our office within a few days to 1 week by e-mail or mail. We value your feedback.  Nobie Putnam, DO [place]

## 2020-04-20 NOTE — Assessment & Plan Note (Signed)
Chronic Emphysema COPD Stable without flare Has some mild persistent breakthrough symptoms, seems suboptimal control on current intermittent use of maintenance Symbicort Reviewed proper dosing 2 puff BID, offer other option if interested possibly reduced frequency dose of other therapy Offer albuterol PRN Tobacco cessation Follow-up if not improving

## 2020-04-20 NOTE — Assessment & Plan Note (Signed)
Controlled cholesterol on statin and lifestyle Last lipid panel 03/2020  Plan: 1. Continue current meds - Simvastatin 20mg  daily 2. Encourage improved lifestyle - low carb/cholesterol, reduce portion size, continue improving regular exercise

## 2020-06-18 ENCOUNTER — Other Ambulatory Visit: Payer: Self-pay

## 2020-06-18 DIAGNOSIS — E1169 Type 2 diabetes mellitus with other specified complication: Secondary | ICD-10-CM

## 2020-06-18 MED ORDER — ONETOUCH ULTRA VI STRP: ORAL_STRIP | 12 refills | Status: DC

## 2020-06-19 DIAGNOSIS — Z23 Encounter for immunization: Secondary | ICD-10-CM | POA: Diagnosis not present

## 2020-06-19 DIAGNOSIS — Z7189 Other specified counseling: Secondary | ICD-10-CM | POA: Diagnosis not present

## 2020-07-04 DIAGNOSIS — M170 Bilateral primary osteoarthritis of knee: Secondary | ICD-10-CM | POA: Diagnosis not present

## 2020-09-14 IMAGING — CT CT PELVIS W/O CM
2 of 3 series · 17 of 46 positions shown, 19 images · non-contrast
Comparison: Radiographs 03/05/2020

CLINICAL DATA: Left hip and low back pain for 4 days.

EXAM:
CT PELVIS WITHOUT CONTRAST
TECHNIQUE: Multidetector CT imaging of the pelvis was performed following the
standard protocol without intravenous contrast.

[Series 3: axial st · axial · 0.70mm/px · z∈[-1052,-806]mm · 14 of 143 slices shown, 16 images]
[im 10/143  soft-tissue]
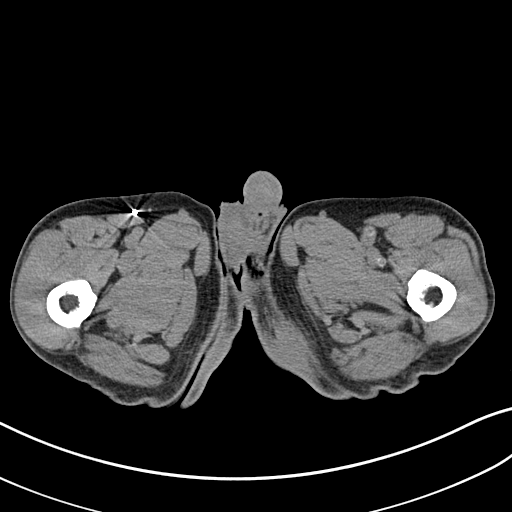
[im 10/143  bone]
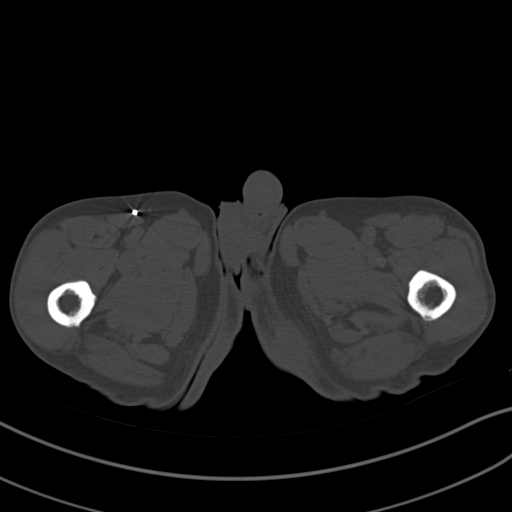
[im 19/143  soft-tissue]
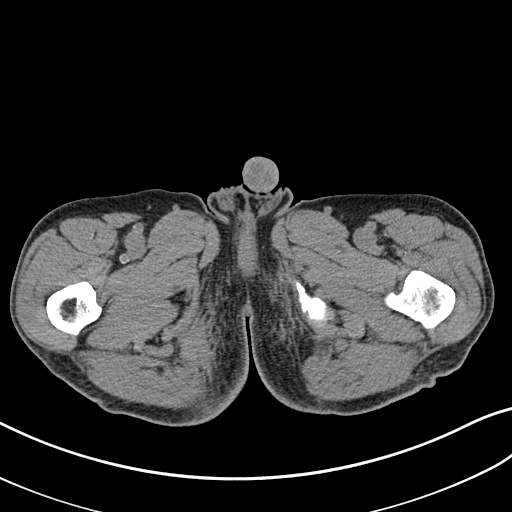
[im 28/143  soft-tissue]
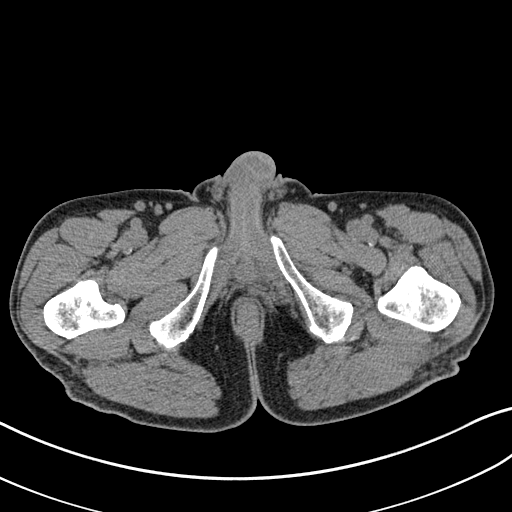
[im 37/143  soft-tissue]
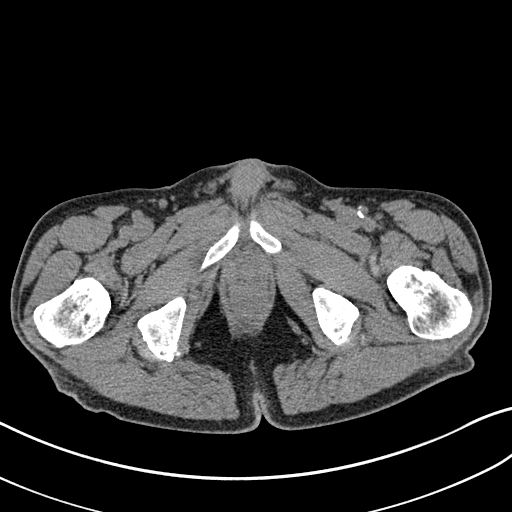
[im 46/143  soft-tissue]
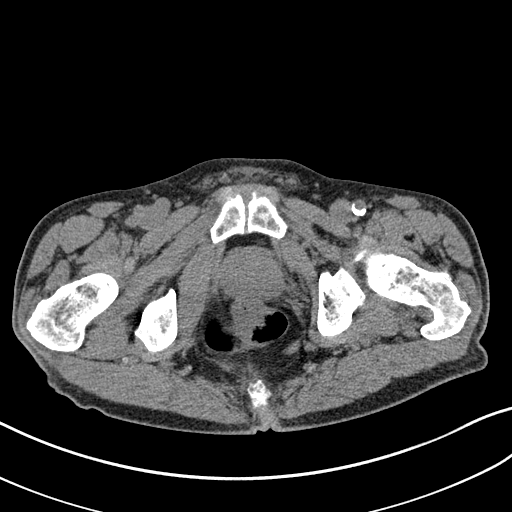
[im 55/143  soft-tissue]
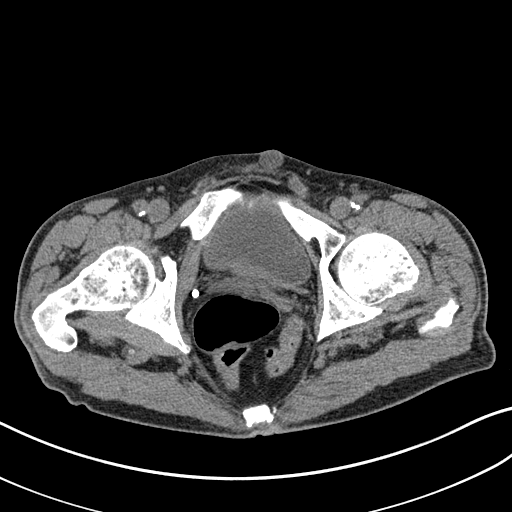
[im 65/143  soft-tissue]
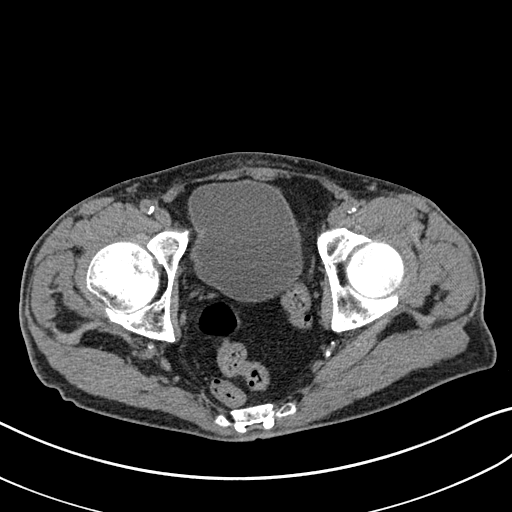
[im 78/143  soft-tissue]
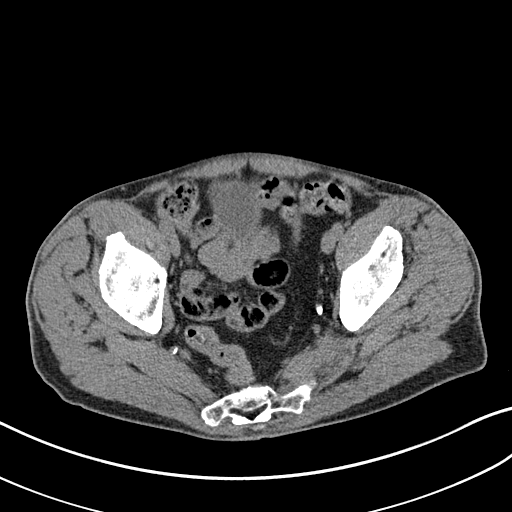
[im 88/143  soft-tissue]
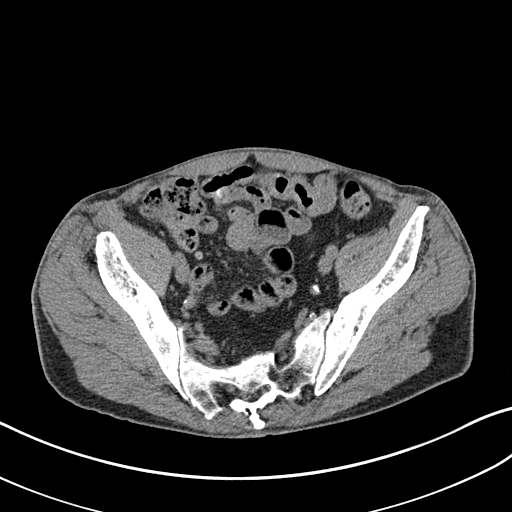
[im 88/143  bone]
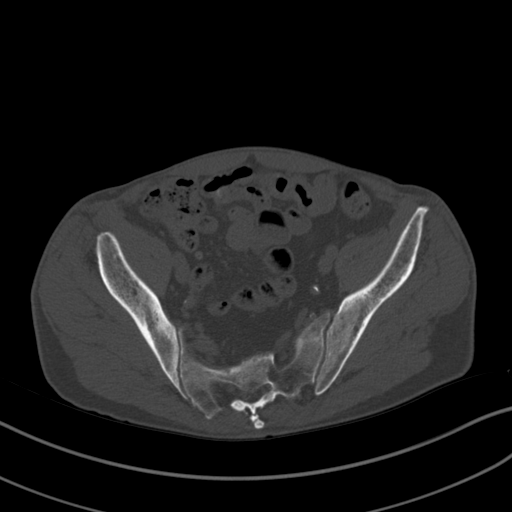
[im 97/143  soft-tissue]
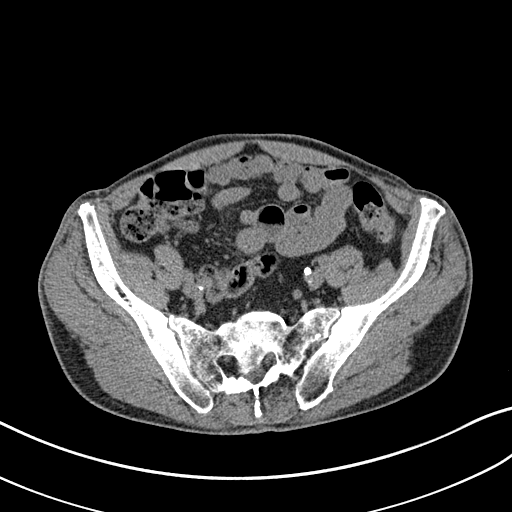
[im 106/143  soft-tissue]
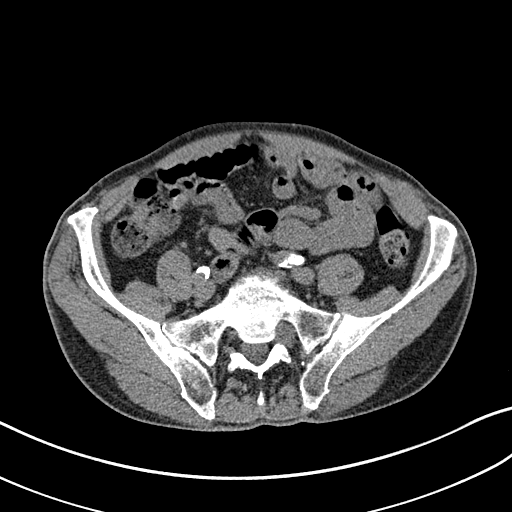
[im 115/143  soft-tissue]
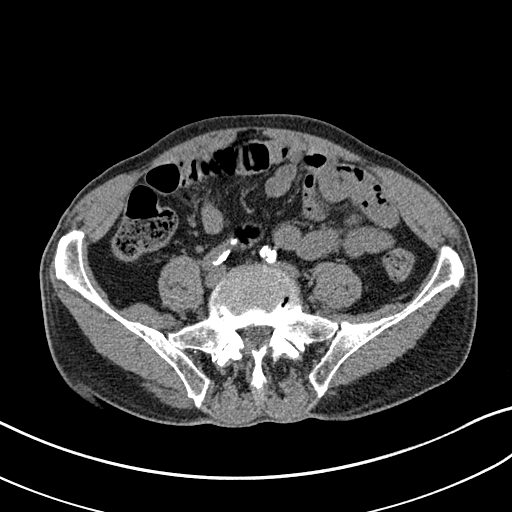
[im 124/143  soft-tissue]
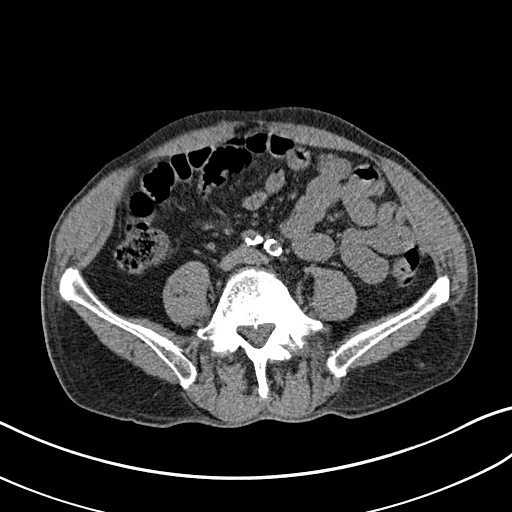
[im 133/143  soft-tissue]
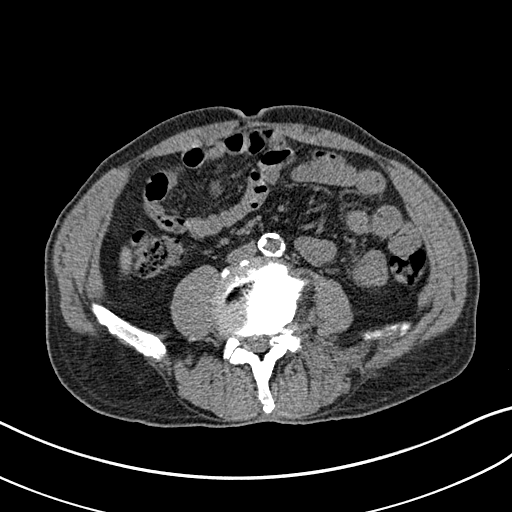

[Series 8: coronal st · coronal · 0.56mm/px · 3 of 122 slices shown]
[im 41/122  soft-tissue]
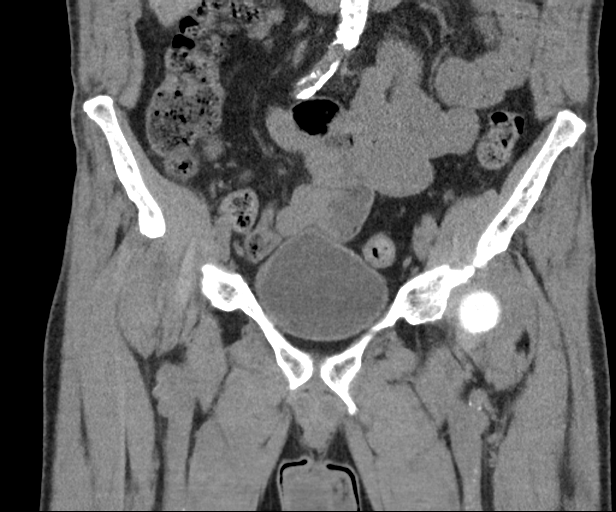
[im 54/122  soft-tissue]
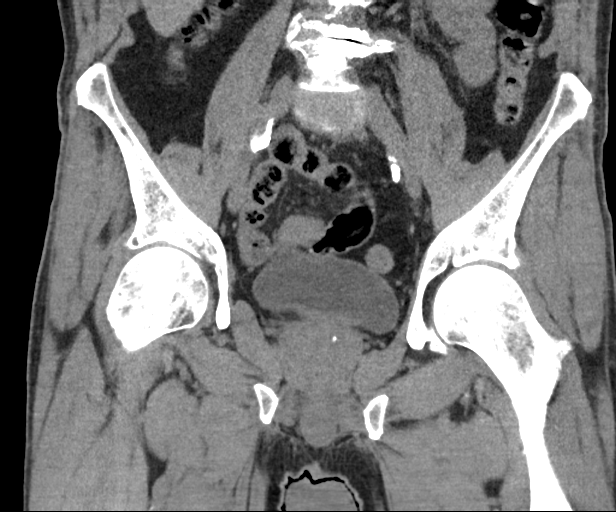
[im 68/122  soft-tissue]
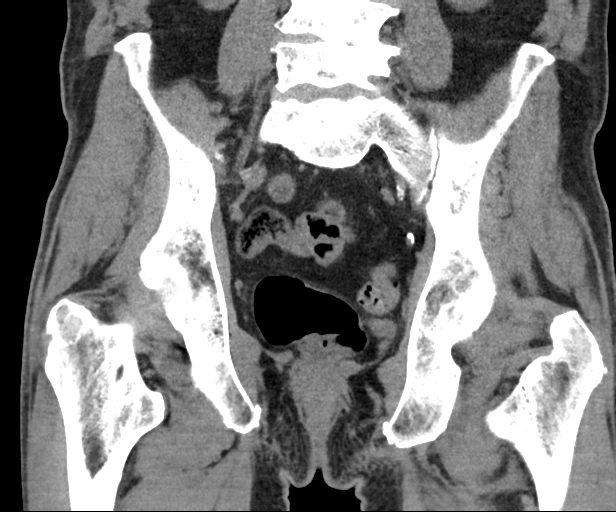

[17 of 46 positions shown; findings below may reference images not displayed]

FINDINGS: Urinary Tract: The bladder is unremarkable. No bladder mass or
calculi.

Bowel: The rectum, sigmoid colon and visualized small-bowel loops
are grossly normal without oral contrast. The appendix is normal.

Vascular/Lymphatic: Advanced atherosclerotic calcifications
involving the distal abdominal aorta and bilateral iliac arteries.
No aneurysm. No pelvic adenopathy or inguinal adenopathy.

Reproductive: The prostate gland and seminal vesicles are
unremarkable.

Other: No free pelvic fluid collections or pelvic mass. Small
subcutaneous cyst in the pubic region is likely a benign sebaceous
cyst.

Musculoskeletal: Both hips are normally located. Remarkably little
degenerative changes for age. No fracture or AVN.

The pubic symphysis and SI joints are intact. No pelvic fractures or
bone lesions. Severe degenerative disc disease noted at L4-5 and
moderate to advanced lower lumbar facet disease. No pars defects.
IMPRESSION: 1. No acute bony findings involving the pelvis or hips.
2. Remarkably little degenerative changes for age.
3. Advanced atherosclerotic calcifications involving the distal
abdominal aorta and bilateral iliac arteries.

Aortic Atherosclerosis ([id number]).

## 2020-09-21 ENCOUNTER — Ambulatory Visit: Payer: Medicare Other | Admitting: Family Medicine

## 2020-09-28 ENCOUNTER — Other Ambulatory Visit: Payer: Self-pay

## 2020-09-28 ENCOUNTER — Encounter: Payer: Self-pay | Admitting: Family Medicine

## 2020-09-28 ENCOUNTER — Ambulatory Visit (INDEPENDENT_AMBULATORY_CARE_PROVIDER_SITE_OTHER): Payer: Medicare Other | Admitting: Family Medicine

## 2020-09-28 VITALS — BP 138/74 | HR 92 | Ht 70.0 in | Wt 139.4 lb

## 2020-09-28 DIAGNOSIS — I10 Essential (primary) hypertension: Secondary | ICD-10-CM | POA: Diagnosis not present

## 2020-09-28 DIAGNOSIS — N400 Enlarged prostate without lower urinary tract symptoms: Secondary | ICD-10-CM

## 2020-09-28 DIAGNOSIS — M170 Bilateral primary osteoarthritis of knee: Secondary | ICD-10-CM | POA: Diagnosis not present

## 2020-09-28 DIAGNOSIS — J432 Centrilobular emphysema: Secondary | ICD-10-CM

## 2020-09-28 DIAGNOSIS — E1169 Type 2 diabetes mellitus with other specified complication: Secondary | ICD-10-CM | POA: Diagnosis not present

## 2020-09-28 LAB — POCT GLYCOSYLATED HEMOGLOBIN (HGB A1C): Hemoglobin A1C: 7.1 % — AB (ref 4.0–5.6)

## 2020-09-28 NOTE — Assessment & Plan Note (Signed)
Well-controlled HTN - Home BP readings  No known complications    Plan:  1. Continue current BP regimen Quinapril 40mg daily, Amlodipine 10mg daily 2. Encourage improved lifestyle - low sodium diet, regular exercise 3. Continue monitor BP outside office, bring readings to next visit, if persistently >140/90 or new symptoms notify office sooner 

## 2020-09-28 NOTE — Patient Instructions (Addendum)
Keep up the great work  Recent Labs    04/05/20 0842 09/28/20 0908  HGBA1C 7.8* 7.1*   You can call and request a Chest X-ray when you are ready.  DUE for FASTING BLOOD WORK (no food or drink after midnight before the lab appointment, only water or coffee without cream/sugar on the morning of)  SCHEDULE "Lab Only" visit in the morning at the clinic for lab draw in 6 MONTHS   - Make sure Lab Only appointment is at about 1 week before your next appointment, so that results will be available  For Lab Results, once available within 2-3 days of blood draw, you can can log in to MyChart online to view your results and a brief explanation. Also, we can discuss results at next follow-up visit.   Please schedule a Follow-up Appointment to: Return in about 6 months (around 03/28/2021) for 6 month Annual Physical (AM, fasting lab AFTER).  If you have any other questions or concerns, please feel free to call the office or send a message through [place]. You may also schedule an earlier appointment if necessary.  Additionally, you may be receiving a survey about your experience at our office within a few days to 1 week by e-mail or mail. We value your feedback.  Nobie Putnam, DO [place]

## 2020-09-28 NOTE — Progress Notes (Signed)
Subjective:    Patient ID: Stephen Savage, male    DOB: [birth year], 80 y.o.   MRN: [id number]  HUXLEY SHURLEY is a 80 y.o. male presenting on 09/28/2020 for Diabetes (5 month f/u.. recheck a1c)   HPI   Centrilobular Emphysema Tobacco Abuse Chronic problem, active smoker 1ppd >60 years. Not ready to quit. He has some occasional breathing problem dyspnea or cough. Takes Symbicort fairly often but not every day, uses PRN sometimes often using at night.. Not using albuterol rescue. Not interested in other inhaler currently  CHRONIC HTN: Reportsno concerns, has controlled BP Current Meds -Quinapril 40mg  daily, Amlodipine 10mg  daily, Reports good compliance, took meds today. Tolerating well, w/o complaints. Denies CP, dyspnea, HA, edema, dizziness / lightheadedness  CHRONIC DM, Type 2: No concerns Last A1c on lab A1c 7.8, prior 6 range. A1c due today CBGs: Avg120-150 in AM and 200 after dinner PM. Checks CBGstwice daily Meds:Metformin XR 500mg  x 2 = 1000mg  BID Reports good compliance. Tolerating well w/o side-effects Currently on ACEi Controlled on Statin Lifestyle: - Diet (Improved)  - Exercise (Active) DM Eye Exam [facility] 04/17/20 Denies hypoglycemia, polyuria, visual changes, numbness or tingling.  Bilateral Knee Pain chronic, Osteoarthritis Back Pain, lumbar disc Followed by [facility] Taking Tylenol  Health Maintenance: Prostate CA Screening: Prior PSA / DRE reported normal. Last PSA 0.2 (03/2020). Currently BPH. No known family history of prostate CA.   UTD PNA, COVID vaccines - need copy of card for booster and LOT#  Depression screen [facility] 2/9 04/20/2020 04/10/2020 02/10/2020  Decreased Interest 0 0 0  Down, Depressed, Hopeless 0 0 0  PHQ - 2 Score 0 0 0    Social History   Tobacco Use  . Smoking status: Current Every Day Smoker    Packs/day: 1.00    Years: 60.00    Pack years: 60.00    Types: Cigarettes  . Smokeless tobacco:  Current User  Vaping Use  . Vaping Use: Former  Substance Use Topics  . Alcohol use: Not Currently  . Drug use: Never    Review of Systems Per HPI unless specifically indicated above     Objective:    BP 138/74 (BP Location: Left Arm, Cuff Size: Normal)   Pulse 92   Ht 5\' 10"  (1.778 m)   Wt 139 lb 6.4 oz (63.2 kg)   SpO2 98%   BMI 20.00 kg/m   Wt Readings from Last 3 Encounters:  09/28/20 139 lb 6.4 oz (63.2 kg)  04/20/20 137 lb (62.1 kg)  04/10/20 140 lb (63.5 kg)    Physical Exam Vitals and nursing note reviewed.  Constitutional:      General: He is not in acute distress.    Appearance: He is well-developed and well-nourished. He is not diaphoretic.     Comments: Well-appearing, comfortable, cooperative  HENT:     Head: Normocephalic and atraumatic.     Mouth/Throat:     Mouth: Oropharynx is clear and moist.  Eyes:     General:        Right eye: No discharge.        Left eye: No discharge.     Conjunctiva/sclera: Conjunctivae normal.  Neck:     Thyroid: No thyromegaly.  Cardiovascular:     Rate and Rhythm: Normal rate and regular rhythm.     Pulses: Intact distal pulses.     Heart sounds: Normal heart sounds. No murmur heard.   Pulmonary:     Effort:  Pulmonary effort is normal. No respiratory distress.     Breath sounds: Wheezing present. No rales.  Musculoskeletal:        General: No edema. Normal range of motion.     Cervical back: Normal range of motion and neck supple.  Lymphadenopathy:     Cervical: No cervical adenopathy.  Skin:    General: Skin is warm and dry.     Findings: No erythema or rash.  Neurological:     Mental Status: He is alert and oriented to person, place, and time.  Psychiatric:        Mood and Affect: Mood and affect normal.        Behavior: Behavior normal.     Comments: Well groomed, good eye contact, normal speech and thoughts      Recent Labs    04/05/20 0842 09/28/20 0908  HGBA1C 7.8* 7.1*    Results for orders  placed or performed in visit on 09/28/20  POCT HgB A1C  Result Value Ref Range   Hemoglobin A1C 7.1 (A) 4.0 - 5.6 %      Assessment & Plan:   Problem List Items Addressed This Visit    Type 2 diabetes mellitus with other specified complication ([place]) - Primary    Improved A1c to 7.1 Home readings reviewed overall average seems within 7-8 Z5G range Complications - hyperlipidemia, coronary artery disease- increases risk of future cardiovascular complications   Plan:  1. Continue current therapy - metformin XR 500mg  x 2 = 1000mg  BID 2. Encourage improved lifestyle - low carb, low sugar diet, reduce portion size, continue improving regular exercise 3. Check CBG , bring log to next visit for review 4. Continue ASA, ACEi, Statin 5. UTD DM Foot, Ophtho      Relevant Orders   POCT HgB A1C (Completed)   Osteoarthritis of knees, bilateral   Essential hypertension    Well-controlled HTN - Home BP readings  No known complications     Plan:  1. Continue current BP regimen Quinapril 40mg  daily, Amlodipine 10mg  daily 2. Encourage improved lifestyle - low sodium diet, regular exercise 3. Continue monitor BP outside office, bring readings to next visit, if persistently >140/90 or new symptoms notify office sooner      Centrilobular emphysema ([place])    Chronic Emphysema COPD Stable without flare Has some mild persistent breakthrough symptoms, seems suboptimal control on current intermittent use of maintenance Symbicort Reviewed proper dosing 2 puff BID, offer other option if interested possibly reduced frequency dose of other therapy Offer albuterol PRN Tobacco cessation Follow-up if not improving  Will order CXR for routine screening for now patient requested, he can do X-ray walk in before upcoming physical      Relevant Orders   DG Chest 2 View   Benign non-nodular prostatic hyperplasia without lower urinary tract symptoms      No orders of the defined types were placed in this  encounter.    Follow up plan: Return in about 6 months (around 03/28/2021) for 6 month Annual Physical (AM, fasting lab AFTER).  Labs AFTER physical  Nobie Putnam, [facility] 09/28/2020, 9:08 AM

## 2020-09-28 NOTE — Assessment & Plan Note (Signed)
Improved A1c to 7.1 Home readings reviewed overall average seems within 7-8 R3U range Complications - hyperlipidemia, coronary artery disease- increases risk of future cardiovascular complications   Plan:  1. Continue current therapy - metformin XR 500mg  x 2 = 1000mg  BID 2. Encourage improved lifestyle - low carb, low sugar diet, reduce portion size, continue improving regular exercise 3. Check CBG , bring log to next visit for review 4. Continue ASA, ACEi, Statin 5. UTD DM Foot, Ophtho

## 2020-09-28 NOTE — Assessment & Plan Note (Addendum)
Chronic Emphysema COPD Stable without flare Has some mild persistent breakthrough symptoms, seems suboptimal control on current intermittent use of maintenance Symbicort Reviewed proper dosing 2 puff BID, offer other option if interested possibly reduced frequency dose of other therapy Offer albuterol PRN Tobacco cessation Follow-up if not improving  Will order CXR for routine screening for now patient requested, he can do X-ray walk in before upcoming physical

## 2020-10-26 ENCOUNTER — Telehealth (INDEPENDENT_AMBULATORY_CARE_PROVIDER_SITE_OTHER): Payer: Medicare Other | Admitting: Family Medicine

## 2020-10-26 ENCOUNTER — Other Ambulatory Visit: Payer: Self-pay

## 2020-10-26 ENCOUNTER — Encounter: Payer: Self-pay | Admitting: Family Medicine

## 2020-10-26 VITALS — Ht 69.0 in | Wt 139.0 lb

## 2020-10-26 DIAGNOSIS — J441 Chronic obstructive pulmonary disease with (acute) exacerbation: Secondary | ICD-10-CM

## 2020-10-26 MED ORDER — PREDNISONE 50 MG PO TABS: 50.0000 mg | ORAL_TABLET | Freq: Every day | ORAL | 0 refills | Status: DC

## 2020-10-26 MED ORDER — AZITHROMYCIN 250 MG PO TABS: ORAL_TABLET | ORAL | 0 refills | Status: DC

## 2020-10-26 NOTE — Progress Notes (Signed)
Virtual Visit via Telephone The purpose of this virtual visit is to provide medical care while limiting exposure to the novel coronavirus (COVID19) for both patient and office staff.  Consent was obtained for phone visit:  Yes.   Answered questions that patient had about telehealth interaction:  Yes.   I discussed the limitations, risks, security and privacy concerns of performing an evaluation and management service by telephone. I also discussed with the patient that there may be a patient responsible charge related to this service. The patient expressed understanding and agreed to proceed.  Patient Location: Home Provider Location: [facility] (Office)  Participants in virtual visit: - Patient: Stephen Savage - CMA: Orinda Kenner, CMA - Provider: Dr Parks Ranger  ---------------------------------------------------------------------- Chief Complaint  Patient presents with  . Nasal Congestion  . Cough    S: Reviewed CMA documentation. I have called patient and gathered additional HPI as follows:  Acute COPD Exacerbation / Centrilobular Emphysema Recent onset with cough and sinusitis, with drainage and productive cough. Some wheezing. Still smoking. Has COPD emphysema. Using rescue inhaler rarely but has Symbicort daily - Tried OTC Alka Seltzer Plus PRN limited relief. COVID Vaccine done, not boosted  Denies any known or suspected exposure to person with or possibly with COVID19.  Denies any fevers, chills, sweats, body ache, headache, abdominal pain, diarrhea  Past Medical History:  Diagnosis Date  . COPD (chronic obstructive pulmonary disease) ([place])   . Hyperlipidemia   . Hypertension   . Knee pain    Social History   Tobacco Use  . Smoking status: Current Every Day Smoker    Packs/day: 1.00    Years: 60.00    Pack years: 60.00    Types: Cigarettes  . Smokeless tobacco: Current User  Vaping Use  . Vaping Use: Former  Substance Use Topics  .  Alcohol use: Not Currently  . Drug use: Never    Current Outpatient Medications:  .  amLODipine (NORVASC) 10 MG tablet, Take 1 tablet (10 mg total) by mouth daily., Disp: 90 tablet, Rfl: 3 .  aspirin EC 81 MG tablet, Take 81 mg by mouth daily. Swallow whole., Disp: , Rfl:  .  azithromycin (ZITHROMAX Z-PAK) 250 MG tablet, Take 2 tabs (500mg  total) on Day 1. Take 1 tab (250mg ) daily for next 4 days., Disp: 6 tablet, Rfl: 0 .  baclofen (LIORESAL) 10 MG tablet, Take 0.5-1 tablets (5-10 mg total) by mouth 3 (three) times daily as needed for muscle spasms., Disp: 30 each, Rfl: 1 .  glucose blood (ONETOUCH ULTRA) test strip, Use to check blood sugar twice daily, Disp: 100 each, Rfl: 12 .  metFORMIN (GLUCOPHAGE-XR) 500 MG 24 hr tablet, Take 2 tablets (1,000 mg total) by mouth 2 (two) times daily with a meal., Disp: 360 tablet, Rfl: 3 .  omeprazole (PRILOSEC) 20 MG capsule, Take 1 capsule (20 mg total) by mouth daily before breakfast., Disp: 90 capsule, Rfl: 3 .  predniSONE (DELTASONE) 50 MG tablet, Take 1 tablet (50 mg total) by mouth daily with breakfast., Disp: 5 tablet, Rfl: 0 .  quinapril (ACCUPRIL) 40 MG tablet, Take 1 tablet (40 mg total) by mouth daily., Disp: 90 tablet, Rfl: 3 .  simvastatin (ZOCOR) 20 MG tablet, Take 1 tablet (20 mg total) by mouth at bedtime., Disp: 90 tablet, Rfl: 3 .  SYMBICORT 80-4.5 MCG/ACT inhaler, Inhale 2 puffs into the lungs 2 (two) times daily., Disp: , Rfl:   Depression screen [facility] 2/9 04/20/2020 04/10/2020 02/10/2020  Decreased Interest 0 0 0  Down, Depressed, Hopeless 0 0 0  PHQ - 2 Score 0 0 0    No flowsheet data found.  -------------------------------------------------------------------------- O: No physical exam performed due to remote telephone encounter.  Lab results reviewed.  Recent Results (from the past 2160 hour(s))  POCT HgB A1C     Status: Abnormal   Collection Time: 09/28/20  9:08 AM  Result Value Ref Range   Hemoglobin A1C 7.1 (A) 4.0 - 5.6 %     -------------------------------------------------------------------------- A&P:  Problem List Items Addressed This Visit   None   Visit Diagnoses    COPD with acute exacerbation ([place])    -  Primary   Relevant Medications   azithromycin (ZITHROMAX Z-PAK) 250 MG tablet   predniSONE (DELTASONE) 50 MG tablet     Consistent with early mild acute exacerbation of COPD with worsening productive cough. Similar to prior exacerbations.  Plan: 1. Start Azithromycin Z pak (antibiotic) 2 tabs day 1, then 1 tab x 4 days, complete entire course even if improved 2. Start Prednisone 50mg  x 5 day steroid burst - may hold steroid if not needed, he can decide 3. Use albuterol q 4 hr regularly x 2-3 days. Continue maintenance inhalers  F/u if not improving, advised COVID19 testing and when to go seek care at hospital ED or UC if not improve may warrant testing and other treatment.   Meds ordered this encounter  Medications  . azithromycin (ZITHROMAX Z-PAK) 250 MG tablet    Sig: Take 2 tabs (500mg  total) on Day 1. Take 1 tab (250mg ) daily for next 4 days.    Dispense:  6 tablet    Refill:  0  . predniSONE (DELTASONE) 50 MG tablet    Sig: Take 1 tablet (50 mg total) by mouth daily with breakfast.    Dispense:  5 tablet    Refill:  0    Follow-up: - Return as needed if not improved 1 week  Patient verbalizes understanding with the above medical recommendations including the limitation of remote medical advice.  Specific follow-up and call-back criteria were given for patient to follow-up or seek medical care more urgently if needed.   - Time spent in direct consultation with patient on phone: 7 minutes   Nobie Putnam, [facility] 10/26/2020, 3:03 PM

## 2020-11-12 ENCOUNTER — Other Ambulatory Visit: Payer: Self-pay | Admitting: Family Medicine

## 2020-11-12 DIAGNOSIS — J441 Chronic obstructive pulmonary disease with (acute) exacerbation: Secondary | ICD-10-CM

## 2020-11-12 MED ORDER — PREDNISONE 10 MG PO TABS: ORAL_TABLET | ORAL | 0 refills | Status: DC

## 2020-12-17 ENCOUNTER — Other Ambulatory Visit (INDEPENDENT_AMBULATORY_CARE_PROVIDER_SITE_OTHER): Payer: Medicare Other

## 2020-12-17 ENCOUNTER — Other Ambulatory Visit: Payer: Self-pay

## 2020-12-17 DIAGNOSIS — Z23 Encounter for immunization: Secondary | ICD-10-CM | POA: Diagnosis not present

## 2020-12-21 DIAGNOSIS — S61213A Laceration without foreign body of left middle finger without damage to nail, initial encounter: Secondary | ICD-10-CM | POA: Diagnosis not present

## 2020-12-21 DIAGNOSIS — L089 Local infection of the skin and subcutaneous tissue, unspecified: Secondary | ICD-10-CM | POA: Diagnosis not present

## 2020-12-21 DIAGNOSIS — S61215A Laceration without foreign body of left ring finger without damage to nail, initial encounter: Secondary | ICD-10-CM | POA: Diagnosis not present

## 2020-12-27 ENCOUNTER — Other Ambulatory Visit: Payer: Self-pay

## 2020-12-27 ENCOUNTER — Encounter: Payer: Medicare Other | Attending: Physician Assistant | Admitting: Physician Assistant

## 2020-12-27 DIAGNOSIS — I2510 Atherosclerotic heart disease of native coronary artery without angina pectoris: Secondary | ICD-10-CM | POA: Diagnosis not present

## 2020-12-27 DIAGNOSIS — L98498 Non-pressure chronic ulcer of skin of other sites with other specified severity: Secondary | ICD-10-CM | POA: Diagnosis not present

## 2020-12-27 DIAGNOSIS — Z885 Allergy status to narcotic agent status: Secondary | ICD-10-CM | POA: Insufficient documentation

## 2020-12-27 DIAGNOSIS — F17218 Nicotine dependence, cigarettes, with other nicotine-induced disorders: Secondary | ICD-10-CM | POA: Insufficient documentation

## 2020-12-27 DIAGNOSIS — J449 Chronic obstructive pulmonary disease, unspecified: Secondary | ICD-10-CM | POA: Insufficient documentation

## 2020-12-27 DIAGNOSIS — S61402A Unspecified open wound of left hand, initial encounter: Secondary | ICD-10-CM | POA: Insufficient documentation

## 2020-12-27 DIAGNOSIS — I779 Disorder of arteries and arterioles, unspecified: Secondary | ICD-10-CM | POA: Insufficient documentation

## 2020-12-27 DIAGNOSIS — E11622 Type 2 diabetes mellitus with other skin ulcer: Secondary | ICD-10-CM | POA: Insufficient documentation

## 2020-12-27 DIAGNOSIS — I10 Essential (primary) hypertension: Secondary | ICD-10-CM | POA: Insufficient documentation

## 2020-12-28 NOTE — Progress Notes (Addendum)
[name], [name] ([id number]) Visit Report for 12/27/2020 Allergy List Details Patient Name: Stephen Savage, Stephen Savage. Date of Service: 12/27/2020 8:30 AM Medical Record Number: [id number] Patient Account Number: [id number] Date of Birth/Sex: Jun 20, 1941 (80 y.o. M) Treating RN: Stephen Savage Primary Care Damarrion Mimbs: Stephen Savage Other Clinician: Referring Ridley Dileo: Gay Filler Treating Zimal Weisensel/Extender: Jeri Cos Weeks in Treatment: 0 Allergies Active Allergies codeine Reaction: GI Severity: Moderate Allergy Notes Electronic Signature(s) Signed: 12/28/2020 10:34:15 AM By: Stephen Savage Entered By: Stephen Savage on 12/27/2020 08:42:00 Stephen Savage ([id number]) -------------------------------------------------------------------------------- Arrival Information Details Patient Name: Stephen Savage. Date of Service: 12/27/2020 8:30 AM Medical Record Number: [id number] Patient Account Number: [id number] Date of Birth/Sex: [birth year] (80 y.o. M) Treating RN: Stephen Savage Primary Care Orlando Devereux: Stephen Savage Other Clinician: Referring Ernesto Lashway: Gay Filler Treating Phylisha Dix/Extender: Skipper Cliche in Treatment: 0 Visit Information Patient Arrived: Ambulatory Arrival Time: 08:36 Accompanied By: wife Transfer Assistance: None Patient Identification Verified: Yes Secondary Verification Process Completed: Yes Patient Has Alerts: Yes Patient Alerts: Patient on Blood Thinner DIABETIC ASPIRIN Electronic Signature(s) Signed: 12/28/2020 10:34:15 AM By: Stephen Savage Entered By: Stephen Savage on 12/27/2020 08:37:23 Stephen Savage ([id number]) -------------------------------------------------------------------------------- Clinic Level of Care Assessment Details Patient Name: Stephen Savage. Date of Service: 12/27/2020 8:30 AM Medical Record Number: [id number] Patient Account Number: [id number] Date of Birth/Sex: 1940-08-30 (80 y.o. M) Treating RN: Dolan Amen Primary Care Cecelia Graciano: Stephen Savage Other Clinician: Referring Raed Schalk: Gay Filler Treating Keigan Tafoya/Extender: Skipper Cliche in Treatment: 0 Clinic Level of Care Assessment Items TOOL 2 Quantity Score X - Use when only an EandM is performed on the INITIAL visit 1 0 ASSESSMENTS - Nursing Assessment / Reassessment X - General Physical Exam (combine w/ comprehensive assessment (listed just below) when performed on new 1 20 pt. evals) X- 1 25 Comprehensive Assessment (HX, ROS, Risk Assessments, Wounds Hx, etc.) ASSESSMENTS - Wound and Skin Assessment / Reassessment []  - Simple Wound Assessment / Reassessment - one wound 0 X- 2 5 Complex Wound Assessment / Reassessment - multiple wounds []  - 0 Dermatologic / Skin Assessment (not related to wound area) ASSESSMENTS - Ostomy and/or Continence Assessment and Care []  - Incontinence Assessment and Management 0 []  - 0 Ostomy Care Assessment and Management (repouching, etc.) PROCESS - Coordination of Care X - Simple Patient / Family Education for ongoing care 1 15 []  - 0 Complex (extensive) Patient / Family Education for ongoing care []  - 0 Staff obtains Programmer, systems, Records, Test Results / Process Orders []  - 0 Staff telephones HHA, Nursing Homes / Clarify orders / etc []  - 0 Routine Transfer to another Facility (non-emergent condition) []  - 0 Routine Hospital Admission (non-emergent condition) []  - 0 New Admissions / Biomedical engineer / Ordering NPWT, Apligraf, etc. []  - 0 Emergency Hospital Admission (emergent condition) X- 1 10 Simple Discharge Coordination []  - 0 Complex (extensive) Discharge Coordination PROCESS - Special Needs []  - Pediatric / Minor Patient Management 0 []  - 0 Isolation Patient Management []  - 0 Hearing / Language / Visual special needs []  - 0 Assessment of Community assistance (transportation, D/C planning, etc.) []  - 0 Additional assistance / Altered mentation []  -  0 Support Surface(s) Assessment (bed, cushion, seat, etc.) INTERVENTIONS - Wound Cleansing / Measurement X - Wound Imaging (photographs - any number of wounds) 1 5 []  - 0 Wound Tracing (instead of photographs) []  - 0 Simple Wound Measurement - one wound X- 2 5 Complex Wound Measurement - multiple wounds Stephen Savage, Stephen Savage. ([id number]) []  - 0  Simple Wound Cleansing - one wound X- 2 5 Complex Wound Cleansing - multiple wounds INTERVENTIONS - Wound Dressings X - Small Wound Dressing one or multiple wounds 1 10 []  - 0 Medium Wound Dressing one or multiple wounds []  - 0 Large Wound Dressing one or multiple wounds []  - 0 Application of Medications - injection INTERVENTIONS - Miscellaneous []  - External ear exam 0 []  - 0 Specimen Collection (cultures, biopsies, blood, body fluids, etc.) []  - 0 Specimen(s) / Culture(s) sent or taken to Lab for analysis []  - 0 Patient Transfer (multiple staff / Civil Service fast streamer / Similar devices) []  - 0 Simple Staple / Suture removal (25 or less) []  - 0 Complex Staple / Suture removal (26 or more) []  - 0 Hypo / Hyperglycemic Management (close monitor of Blood Glucose) []  - 0 Ankle / Brachial Index (ABI) - do not check if billed separately Has the patient been seen at the hospital within the last three years: Yes Total Score: 115 Level Of Care: New/Established - Level 3 Electronic Signature(s) Signed: 12/27/2020 5:06:40 PM By: Georges Mouse, Minus Breeding RN Entered By: Georges Mouse, Minus Breeding on 12/27/2020 09:55:45 Stephen Savage ([id number]) -------------------------------------------------------------------------------- Encounter Discharge Information Details Patient Name: Stephen Savage. Date of Service: 12/27/2020 8:30 AM Medical Record Number: [id number] Patient Account Number: [id number] Date of Birth/Sex: 1940-11-27 (80 y.o. M) Treating RN: Dolan Amen Primary Care Nautia Lem: Stephen Savage Other Clinician: Referring Tirso Laws:  Gay Filler Treating Lavella Myren/Extender: Skipper Cliche in Treatment: 0 Encounter Discharge Information Items Discharge Condition: Stable Ambulatory Status: Ambulatory Discharge Destination: Home Transportation: Private Auto Accompanied By: wife Schedule Follow-up Appointment: Yes Clinical Summary of Care: Electronic Signature(s) Signed: 12/27/2020 5:03:57 PM By: Jeanine Luz Entered By: Jeanine Luz on 12/27/2020 10:04:11 Stephen Savage ([id number]) -------------------------------------------------------------------------------- Lower Extremity Assessment Details Patient Name: Stephen Savage. Date of Service: 12/27/2020 8:30 AM Medical Record Number: [id number] Patient Account Number: [id number] Date of Birth/Sex: Jul 03, 1941 (80 y.o. M) Treating RN: Stephen Savage Primary Care Charly Hunton: Stephen Savage Other Clinician: Referring Shelvy Heckert: Gay Filler Treating Dougles Kimmey/Extender: Jeri Cos Weeks in Treatment: 0 Electronic Signature(s) Signed: 12/28/2020 10:34:15 AM By: Stephen Savage Entered By: Stephen Savage on 12/27/2020 08:44:11 Stephen Savage ([id number]) -------------------------------------------------------------------------------- Multi Wound Chart Details Patient Name: Stephen Savage. Date of Service: 12/27/2020 8:30 AM Medical Record Number: [id number] Patient Account Number: [id number] Date of Birth/Sex: [birth year] (80 y.o. M) Treating RN: Dolan Amen Primary Care Mohini Heathcock: Stephen Savage Other Clinician: Referring Shalondra Wunschel: Gay Filler Treating Evelina Lore/Extender: Skipper Cliche in Treatment: 0 Vital Signs Height(in): 51 Pulse(bpm): 79 Weight(lbs): 138 Blood Pressure(mmHg): 136/81 Body Mass Index(BMI): 20 Temperature(F): 97.7 Respiratory Rate(breaths/min): 20 Photos: [N/A:N/A] Wound Location: Left Hand - 3rd Digit Left Hand - 4th Digit N/A Wounding Event: Trauma Trauma N/A Primary Etiology: Trauma, Other Trauma,  Other N/A Comorbid History: Chronic Obstructive Pulmonary Chronic Obstructive Pulmonary N/A Disease (COPD), Coronary Artery Disease (COPD), Coronary Artery Disease, Hypertension, Type II Disease, Hypertension, Type II Diabetes, Osteoarthritis Diabetes, Osteoarthritis Date Acquired: 12/14/2020 12/14/2020 N/A Weeks of Treatment: 0 0 N/A Wound Status: Open Open N/A Measurements L x W x D (cm) 1x0.2x0.1 2.5x0.5x1 N/A Area (cm) : 0.157 0.982 N/A Volume (cm) : 0.016 0.982 N/A Classification: Full Thickness Without Exposed Full Thickness Without Exposed N/A Support Structures Support Structures Exudate Amount: None Present None Present N/A Necrotic Amount: N/A N/A N/A Necrotic Tissue: Eschar Eschar N/A Exposed Structures: Fat Layer (Subcutaneous Tissue): Fat Layer (Subcutaneous Tissue): N/A Yes Yes Fascia: No Fascia: No Tendon: No Tendon: No Muscle: No Muscle:  No Joint: No Joint: No Bone: No Bone: No Epithelialization: N/A Medium (34-66%) N/A Treatment Notes Electronic Signature(s) Signed: 12/27/2020 5:06:40 PM By: Georges Mouse, Minus Breeding RN Entered By: Georges Mouse, Minus Breeding on 12/27/2020 09:52:00 Stephen Savage ([id number]) -------------------------------------------------------------------------------- [place] Details Patient Name: Stephen Savage. Date of Service: 12/27/2020 8:30 AM Medical Record Number: [id number] Patient Account Number: [id number] Date of Birth/Sex: 1940/12/13 (80 y.o. M) Treating RN: Dolan Amen Primary Care Myrle Dues: Stephen Savage Other Clinician: Referring Shail Urbas: Gay Filler Treating Lesha Jager/Extender: Skipper Cliche in Treatment: 0 Active Inactive Electronic Signature(s) Signed: 01/17/2021 1:54:27 PM By: Gretta Cool, BSN, RN, CWS, Kim RN, BSN Signed: 02/08/2021 4:30:02 PM By: Charlett Nose RN Previous Signature: 12/27/2020 5:06:40 PM Version By: Georges Mouse, Minus Breeding RN Entered By: Gretta Cool, BSN, RN, CWS,  Kim on 01/17/2021 13:54:27 Stephen Savage ([id number]) -------------------------------------------------------------------------------- Pain Assessment Details Patient Name: Stephen Savage. Date of Service: 12/27/2020 8:30 AM Medical Record Number: [id number] Patient Account Number: [id number] Date of Birth/Sex: 1941-04-13 (80 y.o. M) Treating RN: Stephen Savage Primary Care Yassin Scales: Stephen Savage Other Clinician: Referring Chennel Olivos: Gay Filler Treating Griselda Tosh/Extender: Skipper Cliche in Treatment: 0 Active Problems Location of Pain Severity and Description of Pain Patient Has Paino No Site Locations Rate the pain. Current Pain Level: 0 Pain Management and Medication Current Pain Management: Electronic Signature(s) Signed: 12/28/2020 10:34:15 AM By: Stephen Savage Entered By: Stephen Savage on 12/27/2020 08:37:32 Stephen Savage ([id number]) -------------------------------------------------------------------------------- Patient/Caregiver Education Details Patient Name: Stephen Savage. Date of Service: 12/27/2020 8:30 AM Medical Record Number: [id number] Patient Account Number: [id number] Date of Birth/Gender: 05-22-1941 (80 y.o. M) Treating RN: Dolan Amen Primary Care Physician: Stephen Savage Other Clinician: Referring Physician: Gay Filler Treating Physician/Extender: Skipper Cliche in Treatment: 0 Education Assessment Education Provided To: Patient Education Topics Provided Welcome To [facility]: Methods: Explain/Verbal Responses: State content correctly Wound/Skin Impairment: Methods: Explain/Verbal Responses: State content correctly Electronic Signature(s) Signed: 12/27/2020 5:06:40 PM By: Georges Mouse, Minus Breeding RN Entered By: Georges Mouse, Minus Breeding on 12/27/2020 09:56:01 Stephen Savage ([id number]) -------------------------------------------------------------------------------- Wound Assessment Details Patient  Name: Stephen Savage. Date of Service: 12/27/2020 8:30 AM Medical Record Number: [id number] Patient Account Number: [id number] Date of Birth/Sex: Jan 06, 1941 (80 y.o. M) Treating RN: Stephen Savage Primary Care Alyza Artiaga: Stephen Savage Other Clinician: Referring Shavonna Corella: Gay Filler Treating Adreona Brand/Extender: Skipper Cliche in Treatment: 0 Wound Status Wound Number: 1 Primary Trauma, Other Etiology: Wound Location: Left Hand - 3rd Digit Wound Open Wounding Event: Trauma Status: Date Acquired: 12/14/2020 Comorbid Chronic Obstructive Pulmonary Disease (COPD), Coronary Weeks Of Treatment: 0 History: Artery Disease, Hypertension, Type II Diabetes, Clustered Wound: No Osteoarthritis Photos Wound Measurements Length: (cm) 1 Width: (cm) 0.2 Depth: (cm) 0.1 Area: (cm) 0.157 Volume: (cm) 0.016 % Reduction in Area: % Reduction in Volume: Tunneling: No Undermining: No Wound Description Classification: Full Thickness Without Exposed Support Structure Exudate Amount: None Present s Foul Odor After Cleansing: No Slough/Fibrino Yes Wound Bed Necrotic Amount: Large (67-100%) Exposed Structure Necrotic Quality: Eschar Fascia Exposed: No Fat Layer (Subcutaneous Tissue) Exposed: Yes Tendon Exposed: No Muscle Exposed: No Joint Exposed: No Bone Exposed: No Electronic Signature(s) Signed: 12/28/2020 10:34:15 AM By: Stephen Savage Entered By: Stephen Savage on 12/27/2020 08:54:55 Stephen Savage ([id number]) -------------------------------------------------------------------------------- Wound Assessment Details Patient Name: Stephen Savage. Date of Service: 12/27/2020 8:30 AM Medical Record Number: [id number] Patient Account Number: [id number] Date of Birth/Sex: 1941-07-17 (80 y.o. M) Treating RN: Stephen Savage Primary Care Juliette Standre: Stephen Savage Other Clinician: Referring Travon Crochet: Bjorn Loser,  Caryl Pina Treating Allona Gondek/Extender: Jeri Cos Weeks in Treatment:  0 Wound Status Wound Number: 2 Primary Trauma, Other Etiology: Wound Location: Left Hand - 4th Digit Wound Open Wounding Event: Trauma Status: Date Acquired: 12/14/2020 Comorbid Chronic Obstructive Pulmonary Disease (COPD), Coronary Weeks Of Treatment: 0 History: Artery Disease, Hypertension, Type II Diabetes, Clustered Wound: No Osteoarthritis Photos Wound Measurements Length: (cm) 2.5 Width: (cm) 0.5 Depth: (cm) 1 Area: (cm) 0.982 Volume: (cm) 0.982 % Reduction in Area: % Reduction in Volume: Epithelialization: Medium (34-66%) Tunneling: No Undermining: No Wound Description Classification: Full Thickness Without Exposed Support Structure Exudate Amount: None Present s Foul Odor After Cleansing: No Slough/Fibrino Yes Wound Bed Necrotic Amount: Large (67-100%) Exposed Structure Necrotic Quality: Eschar Fascia Exposed: No Fat Layer (Subcutaneous Tissue) Exposed: Yes Tendon Exposed: No Muscle Exposed: No Joint Exposed: No Bone Exposed: No Electronic Signature(s) Signed: 12/28/2020 10:34:15 AM By: Stephen Savage Entered By: Stephen Savage on 12/27/2020 08:56:51 Stephen Savage ([id number]) -------------------------------------------------------------------------------- [place] Details Patient Name: Stephen Savage. Date of Service: 12/27/2020 8:30 AM Medical Record Number: [id number] Patient Account Number: [id number] Date of Birth/Sex: 17-Feb-1941 (80 y.o. M) Treating RN: Stephen Savage Primary Care Pavle Wiler: Stephen Savage Other Clinician: Referring Torris House: Gay Filler Treating Shaleah Nissley/Extender: Skipper Cliche in Treatment: 0 Vital Signs Time Taken: 08:38 Temperature (F): 97.7 Height (in): 69 Pulse (bpm): 94 Weight (lbs): 138 Respiratory Rate (breaths/min): 20 Source: Measured Blood Pressure (mmHg): 136/81 Body Mass Index (BMI): 20.4 Reference Range: 80 - 120 mg / dl Electronic Signature(s) Signed: 12/28/2020 10:34:15 AM By: Stephen Savage Entered ByDonnamarie Savage on 12/27/2020 08:41:23

## 2020-12-28 NOTE — Progress Notes (Signed)
Stephen Savage, Stephen Savage ([id number]) Visit Report for 12/27/2020 Abuse/Suicide Risk Screen Details Patient Name: Stephen Savage, Stephen Savage I. Date of Service: 12/27/2020 8:30 AM Medical Record Number: [id number] Patient Account Number: [id number] Date of Birth/Sex: 16-Feb-1941 (80 y.o. M) Treating RN: Donnamarie Poag Primary Care Remer Couse: Nobie Putnam Other Clinician: Referring Zuzanna Maroney: Gay Filler Treating Monick Rena/Extender: Skipper Cliche in Treatment: 0 Abuse/Suicide Risk Screen Items Answer ABUSE RISK SCREEN: Has anyone close to you tried to hurt or harm you recentlyo No Do you feel uncomfortable with anyone in your familyo No Has anyone forced you do things that you didnot want to doo No Electronic Signature(s) Signed: 12/28/2020 10:34:15 AM By: Donnamarie Poag Entered By: Donnamarie Poag on 12/27/2020 08:44:21 Stephen Savage ([id number]) -------------------------------------------------------------------------------- Activities of Daily Living Details Patient Name: Stephen Spinner I. Date of Service: 12/27/2020 8:30 AM Medical Record Number: [id number] Patient Account Number: [id number] Date of Birth/Sex: 1941/03/08 (80 y.o. M) Treating RN: Donnamarie Poag Primary Care Zoran Yankee: Nobie Putnam Other Clinician: Referring Bryah Ocheltree: Gay Filler Treating Kiyomi Pallo/Extender: Skipper Cliche in Treatment: 0 Activities of Daily Living Items Answer Activities of Daily Living (Please select one for each item) Drive Automobile Completely Able Take Medications Completely Able Use Telephone Completely Able Care for Appearance Completely Able Use Toilet Completely Able Bath / Shower Completely Able Dress Self Completely Able Feed Self Completely Able Walk Completely Able Get In / Out Bed Completely Able Housework Completely Able Prepare Meals Completely [place] for Self Completely Able Electronic Signature(s) Signed: 12/28/2020 10:34:15 AM By:  Donnamarie Poag Entered By: Donnamarie Poag on 12/27/2020 08:42:37 Stephen Savage ([id number]) -------------------------------------------------------------------------------- Education Screening Details Patient Name: Stephen Spinner I. Date of Service: 12/27/2020 8:30 AM Medical Record Number: [id number] Patient Account Number: [id number] Date of Birth/Sex: 06-21-1941 (80 y.o. M) Treating RN: Donnamarie Poag Primary Care Shaleena Crusoe: Nobie Putnam Other Clinician: Referring Nyia Tsao: Gay Filler Treating Roshonda Sperl/Extender: Skipper Cliche in Treatment: 0 Primary Learner Assessed: Patient Learning Preferences/Education Level/Primary Language Learning Preference: Explanation Highest Education Level: Grade School Preferred Language: English Cognitive Barrier Language Barrier: No Translator Needed: No Memory Deficit: No Emotional Barrier: No Cultural/Religious Beliefs Affecting Medical Care: No Physical Barrier Impaired Vision: No Impaired Hearing: No Decreased Hand dexterity: No Knowledge/Comprehension Knowledge Level: High Comprehension Level: High Ability to understand written instructions: High Ability to understand verbal instructions: High Motivation Anxiety Level: Calm Cooperation: Cooperative Education Importance: Acknowledges Need Interest in Health Problems: Asks Questions Perception: Coherent Willingness to Engage in Self-Management High Activities: Readiness to Engage in Self-Management High Activities: Electronic Signature(s) Signed: 12/28/2020 10:34:15 AM By: Donnamarie Poag Entered By: Donnamarie Poag on 12/27/2020 08:43:19 Stephen Savage ([id number]) -------------------------------------------------------------------------------- Fall Risk Assessment Details Patient Name: Stephen Spinner I. Date of Service: 12/27/2020 8:30 AM Medical Record Number: [id number] Patient Account Number: [id number] Date of Birth/Sex: 05-13-1941 (80 y.o. M) Treating RN: Donnamarie Poag Primary Care Les Longmore: Nobie Putnam Other Clinician: Referring Alexie Lanni: Gay Filler Treating Kazimierz Springborn/Extender: Skipper Cliche in Treatment: 0 Fall Risk Assessment Items Have you had 2 or more falls in the last 12 monthso 0 No Have you had any fall that resulted in injury in the last 12 monthso 0 No FALLS RISK SCREEN History of falling - immediate or within 3 months 0 No Secondary diagnosis (Do you have 2 or more medical diagnoseso) 0 No Ambulatory aid None/bed rest/wheelchair/nurse 0 Yes Crutches/cane/walker 0 No Furniture 0 No Intravenous therapy Access/Saline/Heparin Lock 0 No Gait/Transferring Normal/ bed rest/ wheelchair 0 Yes Weak (short steps with or without shuffle, stooped  but able to lift head while walking, may 0 No seek support from furniture) Impaired (short steps with shuffle, may have difficulty arising from chair, head down, impaired 0 No balance) Mental Status Oriented to own ability 0 Yes Electronic Signature(s) Signed: 12/28/2020 10:34:15 AM By: Donnamarie Poag Entered By: Donnamarie Poag on 12/27/2020 08:43:41 Stephen Savage ([id number]) -------------------------------------------------------------------------------- Foot Assessment Details Patient Name: Stephen Spinner I. Date of Service: 12/27/2020 8:30 AM Medical Record Number: [id number] Patient Account Number: [id number] Date of Birth/Sex: 1940/09/18 (80 y.o. M) Treating RN: Donnamarie Poag Primary Care Zoii Florer: Nobie Putnam Other Clinician: Referring Shabazz Mckey: Gay Filler Treating Cathryne Mancebo/Extender: Skipper Cliche in Treatment: 0 Foot Assessment Items Site Locations + = Sensation present, - = Sensation absent, C = Callus, U = Ulcer R = Redness, W = Warmth, M = Maceration, PU = Pre-ulcerative lesion F = Fissure, S = Swelling, D = Dryness Assessment Right: Left: Other Deformity: No No Prior Foot Ulcer: No No Prior Amputation: No No Charcot Joint: No No Ambulatory  Status: Ambulatory Without Help Gait: Steady Electronic Signature(s) Signed: 12/28/2020 10:34:15 AM By: Donnamarie Poag Entered By: Donnamarie Poag on 12/27/2020 08:43:57 Stephen Savage ([id number]) -------------------------------------------------------------------------------- Nutrition Risk Screening Details Patient Name: Stephen Spinner I. Date of Service: 12/27/2020 8:30 AM Medical Record Number: [id number] Patient Account Number: [id number] Date of Birth/Sex: 1941/06/28 (80 y.o. M) Treating RN: Donnamarie Poag Primary Care Gaylen Pereira: Nobie Putnam Other Clinician: Referring Mykael Trott: Gay Filler Treating Isabellamarie Randa/Extender: Skipper Cliche in Treatment: 0 Height (in): 69 Weight (lbs): 138 Body Mass Index (BMI): 20.4 Nutrition Risk Screening Items Score Screening NUTRITION RISK SCREEN: I have an illness or condition that made me change the kind and/or amount of food I eat 0 No I eat fewer than two meals per day 0 No I eat few fruits and vegetables, or milk products 0 No I have three or more drinks of beer, liquor or wine almost every day 0 No I have tooth or mouth problems that make it hard for me to eat 0 No I don't always have enough money to buy the food I need 0 No I eat alone most of the time 0 No I take three or more different prescribed or over-the-counter drugs a day 1 Yes Without wanting to, I have lost or gained 10 pounds in the last six months 0 No I am not always physically able to shop, cook and/or feed myself 0 No Nutrition Protocols Good Risk Protocol 0 No interventions needed Moderate Risk Protocol High Risk Proctocol Risk Level: Good Risk Score: 1 Electronic Signature(s) Signed: 12/28/2020 10:34:15 AM By: Donnamarie Poag Entered ByDonnamarie Poag on 12/27/2020 08:43:49

## 2020-12-28 NOTE — Progress Notes (Signed)
Stephen Savage, Stephen Savage ([id number]) Visit Report for 12/27/2020 Chief Complaint Document Details Patient Name: Stephen Savage, Stephen I. Date of Service: 12/27/2020 8:30 AM Medical Record Number: [id number] Patient Account Number: [id number] Date of Birth/Sex: February 25, 1941 (80 y.o. M) Treating RN: Dolan Amen Primary Care Provider: Nobie Putnam Other Clinician: Referring Provider: Gay Filler Treating Provider/Extender: Skipper Cliche in Treatment: 0 Information Obtained from: Patient Chief Complaint Left 3rd and 4th finger lacerations Electronic Signature(s) Signed: 12/27/2020 9:48:12 AM By: Worthy Keeler PA-C Entered By: Worthy Keeler on 12/27/2020 09:48:12 Stephen Savage ([id number]) -------------------------------------------------------------------------------- HPI Details Patient Name: Stephen Spinner I. Date of Service: 12/27/2020 8:30 AM Medical Record Number: [id number] Patient Account Number: [id number] Date of Birth/Sex: Apr 07, 1941 (80 y.o. M) Treating RN: Dolan Amen Primary Care Provider: Nobie Putnam Other Clinician: Referring Provider: Gay Filler Treating Provider/Extender: Skipper Cliche in Treatment: 0 History of Present Illness HPI Description: 12/27/2020 upon evaluation today patient appears to be doing decently well in regard to lacerations he has over the left hand fourth digit on the distal aspect and the third digit. He tells me that he was actually helping to load a lawnmower on 12/20/2020 when it got stuck going up onto the truck with the ramps. Subsequently he was reaching under the deck trying to help push it up when the blade got pushed and swung around and hitting his fingers. He tells me that he is lucky that he did not have a more significant injury than what he has. With that being said fortunately there does not appear to be any signs of active infection at this time and the laceration/cut areas actually appear to be  doing quite well with there being a chest a small amount of eschar noted. Overall I think that he seems to be tolerating the dressing changes without complication and overall he is probably good to do well with something such as mupirocin just to put over the wound in order to keep it under control and from becoming infected. I do not think that he needs nor wants any type of aggressive bandaging which I completely understand since it seems to be doing so well. He does have a history of diabetes mellitus type 2, hypertension, coronary artery disease, and he is a smoker. Electronic Signature(s) Signed: 12/27/2020 7:59:24 PM By: Worthy Keeler PA-C Entered By: Worthy Keeler on 12/27/2020 19:59:23 Stephen Savage ([id number]) -------------------------------------------------------------------------------- Physical Exam Details Patient Name: Stephen Spinner I. Date of Service: 12/27/2020 8:30 AM Medical Record Number: [id number] Patient Account Number: [id number] Date of Birth/Sex: January 22, 1941 (80 y.o. M) Treating RN: Dolan Amen Primary Care Provider: Nobie Putnam Other Clinician: Referring Provider: Gay Filler Treating Provider/Extender: Skipper Cliche in Treatment: 0 Constitutional sitting or standing blood pressure is within target range for patient.. pulse regular and within target range for patient.Marland Kitchen respirations regular, non- labored and within target range for patient.Marland Kitchen temperature within target range for patient.. Well-nourished and well-hydrated in no acute distress. Eyes conjunctiva clear no eyelid edema noted. pupils equal round and reactive to light and accommodation. Ears, Nose, Mouth, and Throat no gross abnormality of ear auricles or external auditory canals. normal hearing noted during conversation. mucus membranes moist. Respiratory normal breathing without difficulty. Musculoskeletal normal gait and posture. no significant deformity or arthritic  changes, no loss or range of motion, no clubbing. Psychiatric this patient is able to make decisions and demonstrates good insight into disease process. Alert and Oriented x 3. pleasant and cooperative. Notes Upon inspection patient's wound bed  showed signs of some eschar noted over the surface of the wound there really was not much I felt like needed or wanted debridement at this point. For that reason I discussed with the patient that I think the best option may be to just use a little bit of a mupirocin ointment over the area and then subsequently just a small finger Band-Aids and keep this covered especially when he is outside working as I can tell he is going to be 1 to definitely go out to be doing work pretty much daily. He voiced an understanding and states that he has he stays pretty active and busy. Electronic Signature(s) Signed: 12/27/2020 8:00:03 PM By: Worthy Keeler PA-C Entered By: Worthy Keeler on 12/27/2020 20:00:03 Stephen Savage ([id number]) -------------------------------------------------------------------------------- Physician Orders Details Patient Name: Stephen Spinner I. Date of Service: 12/27/2020 8:30 AM Medical Record Number: [id number] Patient Account Number: [id number] Date of Birth/Sex: 04-11-1941 (80 y.o. M) Treating RN: Dolan Amen Primary Care Provider: Nobie Putnam Other Clinician: Referring Provider: Gay Filler Treating Provider/Extender: Skipper Cliche in Treatment: 0 Verbal / Phone Orders: No Diagnosis Coding ICD-10 Coding Code Description [id number] Unspecified open wound of left hand, initial encounter E11.622 Type 2 diabetes mellitus with other skin ulcer I10 Essential (primary) hypertension I25.10 Atherosclerotic heart disease of native coronary artery without angina pectoris F17.218 Nicotine dependence, cigarettes, with other nicotine-induced disorders Follow-up Appointments o Return Appointment in 3  weeks. Bathing/ Shower/ Hygiene o May shower; gently cleanse wound with antibacterial soap, rinse and pat dry prior to dressing wounds Wound Treatment Wound #1 - Hand - 3rd Digit Wound Laterality: Left Cleanser: Normal Saline 1 x Per Day/30 Days Discharge Instructions: Wash your hands with soap and water. Remove old dressing, discard into plastic bag and place into trash. Cleanse the wound with Normal Saline prior to applying a clean dressing using gauze sponges, not tissues or cotton balls. Do not scrub or use excessive force. Pat dry using gauze sponges, not tissue or cotton balls. Topical: Mupirocin Ointment 1 x Per Day/30 Days Discharge Instructions: Apply thin layer Secondary Dressing: Coverlet Latex-Free Fabric Adhesive Dressings 1 x Per Day/30 Days Discharge Instructions: 1.5 x 2 Wound #2 - Hand - 4th Digit Wound Laterality: Left Cleanser: Byram Ancillary Kit - 15 Day Supply (Generic) 1 x Per Day/30 Days Discharge Instructions: Use supplies as instructed; Kit contains: (15) Saline Bullets; (15) 3x3 Gauze; 15 pr Gloves Cleanser: Normal Saline 1 x Per Day/30 Days Discharge Instructions: Wash your hands with soap and water. Remove old dressing, discard into plastic bag and place into trash. Cleanse the wound with Normal Saline prior to applying a clean dressing using gauze sponges, not tissues or cotton balls. Do not scrub or use excessive force. Pat dry using gauze sponges, not tissue or cotton balls. Topical: Mupirocin Ointment 1 x Per Day/30 Days Discharge Instructions: Apply thin layer Secondary Dressing: Coverlet Latex-Free Fabric Adhesive Dressings 1 x Per Day/30 Days Discharge Instructions: 1.5 x 2 Patient Medications Allergies: codeine Notifications Medication Indication Start End mupirocin 12/27/2020 DOSE topical 2 % ointment - ointment topical applied in a thin film to the wound on your fingers then apply the band aid as directed daily Stephen Savage, Stephen Savage  ([id number]) Electronic Signature(s) Signed: 12/27/2020 9:59:14 AM By: Worthy Keeler PA-C Entered By: Worthy Keeler on 12/27/2020 09:59:14 Stephen Savage ([id number]) -------------------------------------------------------------------------------- Problem List Details Patient Name: Stephen Spinner I. Date of Service: 12/27/2020 8:30 AM Medical Record Number: [id number] Patient Account Number: [id number] Date  of Birth/Sex: [birth year] (80 y.o. M) Treating RN: Dolan Amen Primary Care Provider: Nobie Putnam Other Clinician: Referring Provider: Gay Filler Treating Provider/Extender: Skipper Cliche in Treatment: 0 Active Problems ICD-10 Encounter Code Description Active Date MDM Diagnosis [phone] Unspecified open wound of left hand, initial encounter 12/27/2020 No Yes E11.622 Type 2 diabetes mellitus with other skin ulcer 12/27/2020 No Yes I10 Essential (primary) hypertension 12/27/2020 No Yes I25.10 Atherosclerotic heart disease of native coronary artery without angina 12/27/2020 No Yes pectoris F17.218 Nicotine dependence, cigarettes, with other nicotine-induced disorders 12/27/2020 No Yes Inactive Problems Resolved Problems Electronic Signature(s) Signed: 12/27/2020 9:47:42 AM By: Worthy Keeler PA-C Entered By: Worthy Keeler on 12/27/2020 09:47:42 Stephen Savage ([id number]) -------------------------------------------------------------------------------- Progress Note Details Patient Name: Stephen Spinner I. Date of Service: 12/27/2020 8:30 AM Medical Record Number: [id number] Patient Account Number: [id number] Date of Birth/Sex: 1941-03-10 (80 y.o. M) Treating RN: Dolan Amen Primary Care Provider: Nobie Putnam Other Clinician: Referring Provider: Gay Filler Treating Provider/Extender: Skipper Cliche in Treatment: 0 Subjective Chief Complaint Information obtained from Patient Left 3rd and 4th finger lacerations History of  Present Illness (HPI) 12/27/2020 upon evaluation today patient appears to be doing decently well in regard to lacerations he has over the left hand fourth digit on the distal aspect and the third digit. He tells me that he was actually helping to load a lawnmower on 12/20/2020 when it got stuck going up onto the truck with the ramps. Subsequently he was reaching under the deck trying to help push it up when the blade got pushed and swung around and hitting his fingers. He tells me that he is lucky that he did not have a more significant injury than what he has. With that being said fortunately there does not appear to be any signs of active infection at this time and the laceration/cut areas actually appear to be doing quite well with there being a chest a small amount of eschar noted. Overall I think that he seems to be tolerating the dressing changes without complication and overall he is probably good to do well with something such as mupirocin just to put over the wound in order to keep it under control and from becoming infected. I do not think that he needs nor wants any type of aggressive bandaging which I completely understand since it seems to be doing so well. He does have a history of diabetes mellitus type 2, hypertension, coronary artery disease, and he is a smoker. Patient History Information obtained from Patient. Allergies codeine (Severity: Moderate, Reaction: GI) Social History Current every day smoker, Marital Status - Married, Alcohol Use - Never, Drug Use - No History, Caffeine Use - Daily. Medical History Respiratory Patient has history of Chronic Obstructive Pulmonary Disease (COPD) Cardiovascular Patient has history of Coronary Artery Disease, Hypertension Endocrine Patient has history of Type II Diabetes Musculoskeletal Patient has history of Osteoarthritis - knees Patient is treated with Controlled Diet, Oral Agents. Blood sugar is tested. Blood sugar results noted at  the following times: Breakfast - not today. Review of Systems (ROS) Constitutional Symptoms (General Health) Denies complaints or symptoms of Fatigue, Fever, Chills, Marked Weight Change. Eyes Denies complaints or symptoms of Dry Eyes, Vision Changes, Glasses / Contacts. Ear/Nose/Mouth/Throat Denies complaints or symptoms of Difficult clearing ears, Sinusitis. Hematologic/Lymphatic Denies complaints or symptoms of Bleeding / Clotting Disorders, Human Immunodeficiency Virus. Gastrointestinal GERD Genitourinary Denies complaints or symptoms of Kidney failure/ Dialysis, Incontinence/dribbling. Immunological Denies complaints or symptoms of Hives, Itching. Integumentary (  Skin) Denies complaints or symptoms of Wounds, Bleeding or bruising tendency, Breakdown, Swelling. Neurologic Denies complaints or symptoms of Numbness/parasthesias, Focal/Weakness. Psychiatric Denies complaints or symptoms of Anxiety, Claustrophobia. Stephen Savage, Stephen I. ([id number]) Objective Constitutional sitting or standing blood pressure is within target range for patient.. pulse regular and within target range for patient.Marland Kitchen respirations regular, non- labored and within target range for patient.Marland Kitchen temperature within target range for patient.. Well-nourished and well-hydrated in no acute distress. Vitals Time Taken: 8:38 AM, Height: 69 in, Weight: 138 lbs, Source: Measured, BMI: 20.4, Temperature: 97.7 F, Pulse: 94 bpm, Respiratory Rate: 20 breaths/min, Blood Pressure: 136/81 mmHg. Eyes conjunctiva clear no eyelid edema noted. pupils equal round and reactive to light and accommodation. Ears, Nose, Mouth, and Throat no gross abnormality of ear auricles or external auditory canals. normal hearing noted during conversation. mucus membranes moist. Respiratory normal breathing without difficulty. Musculoskeletal normal gait and posture. no significant deformity or arthritic changes, no loss or range of motion, no  clubbing. Psychiatric this patient is able to make decisions and demonstrates good insight into disease process. Alert and Oriented x 3. pleasant and cooperative. General Notes: Upon inspection patient's wound bed showed signs of some eschar noted over the surface of the wound there really was not much I felt like needed or wanted debridement at this point. For that reason I discussed with the patient that I think the best option may be to just use a little bit of a mupirocin ointment over the area and then subsequently just a small finger Band-Aids and keep this covered especially when he is outside working as I can tell he is going to be 1 to definitely go out to be doing work pretty much daily. He voiced an understanding and states that he has he stays pretty active and busy. Integumentary (Hair, Skin) Wound #1 status is Open. Original cause of wound was Trauma. The date acquired was: 12/14/2020. The wound is located on the Left Hand - 3rd Digit. The wound measures 1cm length x 0.2cm width x 0.1cm depth; 0.157cm^2 area and 0.016cm^3 volume. There is Fat Layer (Subcutaneous Tissue) exposed. There is no tunneling or undermining noted. There is a none present amount of drainage noted. There is a large (67-100%) amount of necrotic tissue within the wound bed including Eschar. Wound #2 status is Open. Original cause of wound was Trauma. The date acquired was: 12/14/2020. The wound is located on the Left Hand - 4th Digit. The wound measures 2.5cm length x 0.5cm width x 1cm depth; 0.982cm^2 area and 0.982cm^3 volume. There is Fat Layer (Subcutaneous Tissue) exposed. There is no tunneling or undermining noted. There is a none present amount of drainage noted. There is a large (67-100%) amount of necrotic tissue within the wound bed including Eschar. Assessment Active Problems ICD-10 Unspecified open wound of left hand, initial encounter Type 2 diabetes mellitus with other skin ulcer Essential  (primary) hypertension Atherosclerotic heart disease of native coronary artery without angina pectoris Nicotine dependence, cigarettes, with other nicotine-induced disorders Plan Follow-up Appointments: Return Appointment in 3 weeks. Bathing/ Shower/ Hygiene: May shower; gently cleanse wound with antibacterial soap, rinse and pat dry prior to dressing wounds The following medication(s) was prescribed: mupirocin topical 2 % ointment ointment topical applied in a thin film to the wound on your fingers then apply the band aid as directed daily starting 12/27/2020 WOUND #1: - Hand - 3rd Digit Wound Laterality: Left Stephen Savage, Stephen I. ([id number]) Cleanser: Normal Saline 1 x Per Day/30 Days Discharge Instructions:  Wash your hands with soap and water. Remove old dressing, discard into plastic bag and place into trash. Cleanse the wound with Normal Saline prior to applying a clean dressing using gauze sponges, not tissues or cotton balls. Do not scrub or use excessive force. Pat dry using gauze sponges, not tissue or cotton balls. Topical: Mupirocin Ointment 1 x Per Day/30 Days Discharge Instructions: Apply thin layer Secondary Dressing: Coverlet Latex-Free Fabric Adhesive Dressings 1 x Per Day/30 Days Discharge Instructions: 1.5 x 2 WOUND #2: - Hand - 4th Digit Wound Laterality: Left Cleanser: Byram Ancillary Kit - 15 Day Supply (Generic) 1 x Per Day/30 Days Discharge Instructions: Use supplies as instructed; Kit contains: (15) Saline Bullets; (15) 3x3 Gauze; 15 pr Gloves Cleanser: Normal Saline 1 x Per Day/30 Days Discharge Instructions: Wash your hands with soap and water. Remove old dressing, discard into plastic bag and place into trash. Cleanse the wound with Normal Saline prior to applying a clean dressing using gauze sponges, not tissues or cotton balls. Do not scrub or use excessive force. Pat dry using gauze sponges, not tissue or cotton balls. Topical: Mupirocin Ointment 1 x Per  Day/30 Days Discharge Instructions: Apply thin layer Secondary Dressing: Coverlet Latex-Free Fabric Adhesive Dressings 1 x Per Day/30 Days Discharge Instructions: 1.5 x 2 1. I would recommend currently that we go ahead and initiate treatment with mupirocin ointment I think this would be a good option. 2. I am also can recommend that we have the patient utilize just a finger Band-Aid in order to keep this covered it clean and dry I think that is appropriate. 3. I am also can recommend the patient continue to monitor for any signs of worsening or infection if anything occurs he should let me know soon as possible. We will see patient back for reevaluation in 3 weeks here in the clinic. If anything worsens or changes patient will contact our office for additional recommendations. Electronic Signature(s) Signed: 12/27/2020 8:00:37 PM By: Worthy Keeler PA-C Entered By: Worthy Keeler on 12/27/2020 20:00:37 Stephen Savage ([id number]) -------------------------------------------------------------------------------- ROS/PFSH Details Patient Name: Stephen Spinner I. Date of Service: 12/27/2020 8:30 AM Medical Record Number: [id number] Patient Account Number: [id number] Date of Birth/Sex: 01/30/1941 (80 y.o. M) Treating RN: Donnamarie Poag Primary Care Provider: Nobie Putnam Other Clinician: Referring Provider: Gay Filler Treating Provider/Extender: Skipper Cliche in Treatment: 0 Information Obtained From Patient Constitutional Symptoms (General Health) Complaints and Symptoms: Negative for: Fatigue; Fever; Chills; Marked Weight Change Eyes Complaints and Symptoms: Negative for: Dry Eyes; Vision Changes; Glasses / Contacts Ear/Nose/Mouth/Throat Complaints and Symptoms: Negative for: Difficult clearing ears; Sinusitis Hematologic/Lymphatic Complaints and Symptoms: Negative for: Bleeding / Clotting Disorders; Human Immunodeficiency Virus Genitourinary Complaints and  Symptoms: Negative for: Kidney failure/ Dialysis; Incontinence/dribbling Immunological Complaints and Symptoms: Negative for: Hives; Itching Integumentary (Skin) Complaints and Symptoms: Negative for: Wounds; Bleeding or bruising tendency; Breakdown; Swelling Neurologic Complaints and Symptoms: Negative for: Numbness/parasthesias; Focal/Weakness Psychiatric Complaints and Symptoms: Negative for: Anxiety; Claustrophobia Respiratory Medical History: Positive for: Chronic Obstructive Pulmonary Disease (COPD) Cardiovascular Medical History: Positive for: Coronary Artery Disease; Hypertension Stephen Savage, Stephen I. ([id number]) Gastrointestinal Complaints and Symptoms: Review of System Notes: GERD Endocrine Medical History: Positive for: Type II Diabetes Time with diabetes: 1999 Treated with: Oral agents, Diet Blood sugar tested every day: Yes Tested : daily twice Blood sugar testing results: Breakfast: not today Musculoskeletal Medical History: Positive for: Osteoarthritis - knees Oncologic Immunizations Pneumococcal Vaccine: Received Pneumococcal Vaccination: Yes Implantable Devices None Family and Social History  Current every day smoker; Marital Status - Married; Alcohol Use: Never; Drug Use: No History; Caffeine Use: Daily; Financial Concerns: No; Food, Clothing or Shelter Needs: No; Support System Lacking: No; Transportation Concerns: No Electronic Signature(s) Signed: 12/27/2020 8:15:19 PM By: Worthy Keeler PA-C Signed: 12/28/2020 10:34:15 AM By: Donnamarie Poag Entered By: Donnamarie Poag on 12/27/2020 08:48:24 Stephen Savage ([id number]) -------------------------------------------------------------------------------- SuperBill Details Patient Name: Stephen Spinner I. Date of Service: 12/27/2020 Medical Record Number: [id number] Patient Account Number: [id number] Date of Birth/Sex: August 22, 1940 (80 y.o. M) Treating RN: Dolan Amen Primary Care Provider: Nobie Putnam Other Clinician: Referring Provider: Gay Filler Treating Provider/Extender: Skipper Cliche in Treatment: 0 Diagnosis Coding ICD-10 Codes Code Description [phone] Unspecified open wound of left hand, initial encounter E11.622 Type 2 diabetes mellitus with other skin ulcer I10 Essential (primary) hypertension I25.10 Atherosclerotic heart disease of native coronary artery without angina pectoris F17.218 Nicotine dependence, cigarettes, with other nicotine-induced disorders Facility Procedures CPT4 Code: 62831517 Description: 99213 - WOUND CARE VISIT-LEV 3 EST PT Modifier: Quantity: 1 Physician Procedures CPT4 Code: 6160737 Description: 10626 - WC PHYS LEVEL 4 - NEW PT Modifier: Quantity: 1 CPT4 Code: Description: ICD-10 Diagnosis Description S61.402A Unspecified open wound of left hand, initial encounter E11.622 Type 2 diabetes mellitus with other skin ulcer I10 Essential (primary) hypertension I25.10 Atherosclerotic heart disease of native coronary  artery without angina Modifier: pectoris Quantity: Electronic Signature(s) Signed: 12/27/2020 8:00:50 PM By: Worthy Keeler PA-C Previous Signature: 12/27/2020 5:06:40 PM Version By: Georges Mouse, Minus Breeding RN Entered By: Worthy Keeler on 12/27/2020 20:00:50

## 2021-01-17 ENCOUNTER — Ambulatory Visit: Payer: Medicare Other | Admitting: Physician Assistant

## 2021-02-22 DIAGNOSIS — M48062 Spinal stenosis, lumbar region with neurogenic claudication: Secondary | ICD-10-CM | POA: Diagnosis not present

## 2021-02-22 DIAGNOSIS — E088 Diabetes mellitus due to underlying condition with unspecified complications: Secondary | ICD-10-CM | POA: Diagnosis not present

## 2021-03-11 DIAGNOSIS — G8929 Other chronic pain: Secondary | ICD-10-CM | POA: Diagnosis not present

## 2021-03-11 DIAGNOSIS — M48062 Spinal stenosis, lumbar region with neurogenic claudication: Secondary | ICD-10-CM | POA: Diagnosis not present

## 2021-03-11 DIAGNOSIS — M5442 Lumbago with sciatica, left side: Secondary | ICD-10-CM | POA: Diagnosis not present

## 2021-03-29 ENCOUNTER — Other Ambulatory Visit: Payer: Self-pay

## 2021-03-29 DIAGNOSIS — J441 Chronic obstructive pulmonary disease with (acute) exacerbation: Secondary | ICD-10-CM

## 2021-03-29 MED ORDER — SYMBICORT 80-4.5 MCG/ACT IN AERO: 2.0000 | INHALATION_SPRAY | Freq: Two times a day (BID) | RESPIRATORY_TRACT | 1 refills | Status: DC

## 2021-04-02 ENCOUNTER — Ambulatory Visit: Payer: Self-pay | Admitting: *Deleted

## 2021-04-02 NOTE — Telephone Encounter (Signed)
Pt reports cough and sore throat, onset 1 week ago "Or longer." States cough productive "In the mornings mostly, whitish." Reports runny nose "Yellowish." Also reports sore throat "From coughing." Denies fever. Reports mild SOB from coughing "But I smoke so I'm sometimes SOB, maybe a little worse." States inhalers help. Has not been covid tested. Advised testing, assisted pt to find local site. States he will do so and CB with results. Assured pt NT would route to PCP's review and final disposition. Advised ED for worsening symptoms. Pt verbalizes understanding. CB# [phone]

## 2021-04-02 NOTE — Telephone Encounter (Signed)
Agree with triage.  Covid testing is appropriate.  Can do follow-up virtual visit if/when needed if symptoms unresolved.  If worsening or severe symptoms seek care at [facility] / ED if need  Nobie Putnam, [facility] 04/02/2021, 3:09 PM

## 2021-04-02 NOTE — Telephone Encounter (Signed)
Patient shares that they have experienced a sore throat and cough for roughly a week   The patient shares that they have taken over the counter medicine but feel little to no relief   The patient was at a gather yesterday for their birthday and continued to experience discomfort   Please contact further when possible  Reason for Disposition  [1] Known COPD or other severe lung disease (i.e., bronchiectasis, cystic fibrosis, lung surgery) AND [2] worsening symptoms (i.e., increased sputum purulence or amount, increased breathing difficulty  Answer Assessment - Initial Assessment Questions 1. ONSET: "When did the cough begin?"      1 week or more 2. SEVERITY: "How bad is the cough today?"      Moderate 3. SPUTUM: "Describe the color of your sputum" (none, dry cough; clear, white, yellow, green)     Whitish 4. HEMOPTYSIS: "Are you coughing up any blood?" If so ask: "How much?" (flecks, streaks, tablespoons, etc.)     no 5. DIFFICULTY BREATHING: "Are you having difficulty breathing?" If Yes, ask: "How bad is it?" (e.g., mild, moderate, severe)    - MILD: No SOB at rest, mild SOB with walking, speaks normally in sentences, can lie down, no retractions, pulse < 100.    - MODERATE: SOB at rest, SOB with minimal exertion and prefers to sit, cannot lie down flat, speaks in phrases, mild retractions, audible wheezing, pulse 100-120.    - SEVERE: Very SOB at rest, speaks in single words, struggling to breathe, sitting hunched forward, retractions, pulse > 120      Mild "I smoke" 6. FEVER: "Do you have a fever?" If Yes, ask: "What is your temperature, how was it measured, and when did it start?"     no 7. CARDIAC HISTORY: "Do you have any history of heart disease?" (e.g., heart attack, congestive heart failure)      *No Answer* 8. LUNG HISTORY: "Do you have any history of lung disease?"  (e.g., pulmonary embolus, asthma, emphysema)     COLD 9. PE RISK FACTORS: "Do you have a history of blood  clots?" (or: recent major surgery, recent prolonged travel, bedridden)     *No Answer* 10. OTHER SYMPTOMS: "Do you have any other symptoms?" (e.g., runny nose, wheezing, chest pain)       Sore throat,"From coughing" runny nose  Protocols used: Cough - Acute Productive-A-AH

## 2021-04-04 ENCOUNTER — Encounter: Payer: Medicare Other | Admitting: Family Medicine

## 2021-05-14 ENCOUNTER — Ambulatory Visit (INDEPENDENT_AMBULATORY_CARE_PROVIDER_SITE_OTHER): Payer: Medicare Other | Admitting: Family Medicine

## 2021-05-14 ENCOUNTER — Other Ambulatory Visit: Payer: Self-pay | Admitting: Family Medicine

## 2021-05-14 VITALS — Wt 139.0 lb

## 2021-05-14 DIAGNOSIS — Z23 Encounter for immunization: Secondary | ICD-10-CM

## 2021-06-03 ENCOUNTER — Other Ambulatory Visit: Payer: Self-pay | Admitting: Family Medicine

## 2021-06-03 DIAGNOSIS — E1169 Type 2 diabetes mellitus with other specified complication: Secondary | ICD-10-CM

## 2021-06-03 NOTE — Telephone Encounter (Signed)
Requested Prescriptions  Pending Prescriptions Disp Refills  . metFORMIN (GLUCOPHAGE-XR) 500 MG 24 hr tablet [Pharmacy Med Name: metformin ER 500 mg tablet,extended release 24 hr] 360 tablet 0    Sig: TAKE TWO TABLETS BY MOUTH ONCE DAILY WITH BREAKFAST     Endocrinology:  Diabetes - Biguanides Failed - 06/03/2021  9:53 AM      Failed - Cr in normal range and within 360 days    Creat  Date Value Ref Range Status  04/05/2020 0.89 0.70 - 1.11 mg/dL Final    Comment:    For patients >53 years of age, the reference limit for Creatinine is approximately 13% higher for people identified as African-American. .          Failed - HBA1C is between 0 and 7.9 and within 180 days    Hemoglobin A1C  Date Value Ref Range Status  09/28/2020 7.1 (A) 4.0 - 5.6 % Final  09/29/2019 6.9  Final    Comment:    [facility]   Hgb A1c MFr Bld  Date Value Ref Range Status  04/05/2020 7.8 (H) <5.7 % of total Hgb Final    Comment:    For someone without known diabetes, a hemoglobin A1c value of 6.5% or greater indicates that they may have  diabetes and this should be confirmed with a follow-up  test. . For someone with known diabetes, a value <7% indicates  that their diabetes is well controlled and a value  greater than or equal to 7% indicates suboptimal  control. A1c targets should be individualized based on  duration of diabetes, age, comorbid conditions, and  other considerations. . Currently, no consensus exists regarding use of hemoglobin A1c for diagnosis of diabetes for children. .          Failed - eGFR in normal range and within 360 days    GFR, Est African American  Date Value Ref Range Status  04/05/2020 93 > OR = 60 mL/min/1.39m Final   GFR, Est Non African American  Date Value Ref Range Status  04/05/2020 80 > OR = 60 mL/min/1.743mFinal         Failed - Valid encounter within last 6 months    Recent Outpatient Visits          2 weeks ago [facility]   [facility]   8 months ago Type 2 diabetes mellitus with other specified complication, [facility] long-term [facility] use [facility]   1 year ago Type 2 diabetes mellitus with other specified complication, without long-term current use of [facility]   1 year ago [facility] pain, left   [facility] [place]      Future Appointments            In 2 weeks [facility] [facility]

## 2021-06-07 ENCOUNTER — Ambulatory Visit: Payer: Self-pay

## 2021-06-07 DIAGNOSIS — E1169 Type 2 diabetes mellitus with other specified complication: Secondary | ICD-10-CM

## 2021-06-07 NOTE — Telephone Encounter (Signed)
[employer] called and spoke to [place], [facility] about Metformin. She says the patient came in and asked if he is supposed to be taking Metformin 500 mg 2 tabs daily (Rx received by the pharmacy on 06/03/21) because the last prescription (04/20/20) was for Metformin 500 mg 2 tabs BID. He says he wasn't told to change the medication. She is calling to clarify. I advised I will send this to the provider because I'm not sure why it was changed from what I can see in the patient's chart and he will address it. She verbalized understanding. Called the office and spoke to [place], [facility] to alert her to this note being routed to address today possibly by provider.  Message from Riverside Callas sent at 06/07/2021 12:33 PM EDT  Joellen Jersey at [facility] called needing a clarification on pt's metformin and on instructions.   CB#  [phone]    Reason for Disposition  [1] Caller requesting NON-URGENT health information AND [2] PCP's office is the best resource  Answer Assessment - Initial Assessment Questions 1. REASON FOR CALL or QUESTION: "What is your reason for calling today?" or "How can I best help you?" or "What question do you have that I can help answer?"     Need clarification on Metformin  Protocols used: Information Only Call - No Triage-A-AH

## 2021-06-10 MED ORDER — METFORMIN HCL ER 500 MG PO TB24: 1000.0000 mg | ORAL_TABLET | Freq: Two times a day (BID) | ORAL | 3 refills | Status: DC

## 2021-06-10 NOTE — Telephone Encounter (Signed)
New rx with corrected instructions sent to [facility].  rx should be 1000mg  TWICE per day, he takes XR version.  Nobie Putnam, [facility] 06/10/2021, 1:21 PM

## 2021-06-10 NOTE — Addendum Note (Signed)
Addended by: Olin Hauser on: 06/10/2021 01:21 PM   Modules accepted: Orders

## 2021-06-18 ENCOUNTER — Other Ambulatory Visit: Payer: Self-pay

## 2021-06-18 ENCOUNTER — Ambulatory Visit (INDEPENDENT_AMBULATORY_CARE_PROVIDER_SITE_OTHER): Payer: Medicare Other | Admitting: Family Medicine

## 2021-06-18 ENCOUNTER — Encounter: Payer: Self-pay | Admitting: Family Medicine

## 2021-06-18 VITALS — BP 121/65 | HR 92 | Ht 69.0 in | Wt 134.6 lb

## 2021-06-18 DIAGNOSIS — N400 Enlarged prostate without lower urinary tract symptoms: Secondary | ICD-10-CM | POA: Diagnosis not present

## 2021-06-18 DIAGNOSIS — E785 Hyperlipidemia, unspecified: Secondary | ICD-10-CM | POA: Diagnosis not present

## 2021-06-18 DIAGNOSIS — I10 Essential (primary) hypertension: Secondary | ICD-10-CM

## 2021-06-18 DIAGNOSIS — J432 Centrilobular emphysema: Secondary | ICD-10-CM

## 2021-06-18 DIAGNOSIS — K219 Gastro-esophageal reflux disease without esophagitis: Secondary | ICD-10-CM | POA: Diagnosis not present

## 2021-06-18 DIAGNOSIS — Z0000 Encounter for general adult medical examination without abnormal findings: Secondary | ICD-10-CM

## 2021-06-18 DIAGNOSIS — E1169 Type 2 diabetes mellitus with other specified complication: Secondary | ICD-10-CM | POA: Diagnosis not present

## 2021-06-18 DIAGNOSIS — M170 Bilateral primary osteoarthritis of knee: Secondary | ICD-10-CM | POA: Diagnosis not present

## 2021-06-18 MED ORDER — QUINAPRIL HCL 40 MG PO TABS: 40.0000 mg | ORAL_TABLET | Freq: Every day | ORAL | 3 refills | Status: DC

## 2021-06-18 MED ORDER — AMLODIPINE BESYLATE 10 MG PO TABS: 10.0000 mg | ORAL_TABLET | Freq: Every day | ORAL | 3 refills | Status: DC

## 2021-06-18 MED ORDER — OMEPRAZOLE 20 MG PO CPDR: 20.0000 mg | DELAYED_RELEASE_CAPSULE | Freq: Every day | ORAL | 3 refills | Status: DC

## 2021-06-18 MED ORDER — BREZTRI AEROSPHERE 160-9-4.8 MCG/ACT IN AERO: 2.0000 | INHALATION_SPRAY | Freq: Two times a day (BID) | RESPIRATORY_TRACT | 0 refills | Status: DC

## 2021-06-18 MED ORDER — SIMVASTATIN 20 MG PO TABS: 20.0000 mg | ORAL_TABLET | Freq: Every day | ORAL | 3 refills | Status: DC

## 2021-06-18 NOTE — Assessment & Plan Note (Signed)
Controlled cholesterol on statin and lifestyle Last lipid panel 03/2020 Due for fasting lipid  Plan: 1. Continue current meds - Simvastatin 20mg  daily 2. Encourage improved lifestyle - low carb/cholesterol, reduce portion size, continue improving regular exercise

## 2021-06-18 NOTE — Assessment & Plan Note (Signed)
Well-controlled HTN - Home BP readings  No known complications    Plan:  1. Continue current BP regimen Quinapril 40mg daily, Amlodipine 10mg daily 2. Encourage improved lifestyle - low sodium diet, regular exercise 3. Continue monitor BP outside office, bring readings to next visit, if persistently >140/90 or new symptoms notify office sooner 

## 2021-06-18 NOTE — Assessment & Plan Note (Addendum)
Chronic Emphysema COPD Stable without flare Has some mild persistent breakthrough symptoms, seems suboptimal control on current intermittent use of maintenance Symbicort  Switch to Breztri sample x 2 for 2 weeks, 2 puff BID, if interested we can order rx  Offer albuterol PRN Tobacco cessation Follow-up if not improving  He has aged out of LDCT screening age >35 Future X-ray if interested

## 2021-06-18 NOTE — Assessment & Plan Note (Signed)
Improved A1c to 7.1 previously, due for repeat lab A1c Home readings reviewed overall average seems within 7-8 O3A range Complications - hyperlipidemia, coronary artery disease- increases risk of future cardiovascular complications   Plan:  1. Continue current therapy - metformin XR 500mg  x 2 = 1000mg  BID recently ordered 2. Encourage improved lifestyle - low carb, low sugar diet, reduce portion size, continue improving regular exercise 3. Check CBG , bring log to next visit for review 4. Continue ASA, ACEi, Statin 5. UTD DM Foot, Ophtho

## 2021-06-18 NOTE — Progress Notes (Signed)
Subjective:    Patient ID: Stephen Savage, male    DOB: October 11, 1940, 80 y.o.   MRN: [id number]  Stephen Savage is a 80 y.o. male presenting on 06/18/2021 for Annual Exam   HPI  Here for Annual Physical and Due for fasting labs.  Centrilobular Emphysema Tobacco Abuse Chronic problem, active smoker 1ppd >60 years. Not ready to quit. He has some occasional breathing problem dyspnea or cough. Takes Symbicort fairly often but not every day, uses PRN sometimes often using at night.. Not using albuterol rescue. Not interested in other inhaler currently Switch Symbicort to [facility].   CHRONIC HTN: Reports no concerns, has controlled BP Current Meds - Quinapril 40mg  daily, Amlodipine 10mg  daily,    Reports good compliance, took meds today. Tolerating well, w/o complaints. Denies CP, dyspnea, HA, edema, dizziness / lightheadedness   CHRONIC DM, Type 2: No concerns Last A1c 7.1 (09/2020) CBGs: Avg 120-150 in AM and 200 after dinner PM. Checks CBGs twice daily Meds: Metformin XR 500mg  x 2 = 1000mg  BID Reports good compliance. Tolerating well w/o side-effects Currently on ACEi Controlled on Statin Lifestyle: - Diet (Improved)  - Exercise (Active) DM Eye Exam [facility] 04/17/20 Denies hypoglycemia, polyuria, visual changes, numbness or tingling.   Bilateral Knee Pain chronic, Osteoarthritis Back Pain, lumbar disc Followed by [facility] Taking Tylenol   Health Maintenance: Prostate CA Screening: Prior PSA / DRE reported normal. Last PSA 0.2 (03/2020). Currently BPH. No known family history of prostate CA.  Due for repeat PSA   UTD PNA, COVID vaccines - need copy of card for booster and LOT#   Depression screen [facility] 2/9 06/18/2021 04/20/2020 04/10/2020  Decreased Interest 0 0 0  Down, Depressed, Hopeless 0 0 0  PHQ - 2 Score 0 0 0    Past Medical History:  Diagnosis Date   COPD (chronic obstructive pulmonary disease) (HCC)    Hyperlipidemia    Hypertension     Knee pain    History reviewed. No pertinent surgical history. Social History   Socioeconomic History   Marital status: Married    Spouse name: Not on file   Number of children: Not on file   Years of education: Not on file   Highest education level: Not on file  Occupational History   Occupation: retired  Tobacco Use   Smoking status: Every Day    Packs/day: 1.00    Years: 60.00    Pack years: 60.00    Types: Cigarettes   Smokeless tobacco: Current  Vaping Use   Vaping Use: Former  Substance and Sexual Activity   Alcohol use: Not Currently   Drug use: Never   Sexual activity: Not on file  Other Topics Concern   Not on file  Social History Narrative   Not on file   Social Determinants of Health   Financial Resource Strain: Not on file  Food Insecurity: Not on file  Transportation Needs: Not on file  Physical Activity: Not on file  Stress: Not on file  Social Connections: Not on file  Intimate Partner Violence: Not on file   Family History  Problem Relation Age of Onset   Heart disease Mother 56   Heart attack Mother    Diabetes Father 48   Current Outpatient Medications on File Prior to Visit  Medication Sig   aspirin EC 81 MG tablet Take 81 mg by mouth daily. Swallow whole.   baclofen (LIORESAL) 10 MG tablet Take 0.5-1 tablets (5-10 mg total)  by mouth 3 (three) times daily as needed for muscle spasms.   glucose blood (ONETOUCH ULTRA) test strip Use to check blood sugar twice daily   metFORMIN (GLUCOPHAGE-XR) 500 MG 24 hr tablet Take 2 tablets (1,000 mg total) by mouth 2 (two) times daily with a meal.   No current facility-administered medications on file prior to visit.    Review of Systems  Constitutional:  Negative for activity change, appetite change, chills, diaphoresis, fatigue and fever.  HENT:  Negative for congestion and hearing loss.   Eyes:  Negative for visual disturbance.  Respiratory:  Negative for cough, chest tightness, shortness of breath  and wheezing.   Cardiovascular:  Negative for chest pain, palpitations and leg swelling.  Gastrointestinal:  Negative for abdominal pain, constipation, diarrhea, nausea and vomiting.  Genitourinary:  Negative for dysuria, frequency and hematuria.  Musculoskeletal:  Negative for arthralgias and neck pain.  Skin:  Negative for rash.  Neurological:  Negative for dizziness, weakness, light-headedness, numbness and headaches.  Hematological:  Negative for adenopathy.  Psychiatric/Behavioral:  Negative for behavioral problems, dysphoric mood and sleep disturbance.   Per HPI unless specifically indicated above      Objective:    BP 121/65   Pulse 92   Ht 5\' 9"  (1.753 m)   Wt 134 lb 9.6 oz (61.1 kg)   SpO2 96%   BMI 19.88 kg/m   Wt Readings from Last 3 Encounters:  06/18/21 134 lb 9.6 oz (61.1 kg)  05/14/21 139 lb (63 kg)  10/26/20 139 lb (63 kg)    Physical Exam Vitals and nursing note reviewed.  Constitutional:      General: He is not in acute distress.    Appearance: He is well-developed. He is not diaphoretic.     Comments: Well-appearing, comfortable, cooperative  HENT:     Head: Normocephalic and atraumatic.  Eyes:     General:        Right eye: No discharge.        Left eye: No discharge.     Conjunctiva/sclera: Conjunctivae normal.     Pupils: Pupils are equal, round, and reactive to light.  Neck:     Thyroid: No thyromegaly.  Cardiovascular:     Rate and Rhythm: Normal rate and regular rhythm.     Pulses: Normal pulses.     Heart sounds: Normal heart sounds. No murmur heard. Pulmonary:     Effort: Pulmonary effort is normal. No respiratory distress.     Breath sounds: Normal breath sounds. No wheezing or rales.  Abdominal:     General: Bowel sounds are normal. There is no distension.     Palpations: Abdomen is soft. There is no mass.     Tenderness: There is no abdominal tenderness.  Musculoskeletal:        General: No tenderness. Normal range of motion.      Cervical back: Normal range of motion and neck supple.     Right lower leg: No edema.     Left lower leg: No edema.     Comments: Mild crepitus lower extremity  Upper / Lower Extremities: - Normal muscle tone, strength bilateral upper extremities 5/5, lower extremities 5/5  Lymphadenopathy:     Cervical: No cervical adenopathy.  Skin:    General: Skin is warm and dry.     Findings: No erythema or rash.  Neurological:     Mental Status: He is alert and oriented to person, place, and time.     Comments: Distal  sensation intact to light touch all extremities  Psychiatric:        Mood and Affect: Mood normal.        Behavior: Behavior normal.        Thought Content: Thought content normal.     Comments: Well groomed, good eye contact, normal speech and thoughts    Diabetic Foot Exam - Simple   Simple Foot Form Diabetic Foot exam was performed with the following findings: Yes 06/18/2021  1:57 PM  Visual Inspection No deformities, no ulcerations, no other skin breakdown bilaterally: Yes Sensation Testing Intact to touch and monofilament testing bilaterally: Yes Pulse Check Posterior Tibialis and Dorsalis pulse intact bilaterally: Yes Comments    Recent Labs    09/28/20 0908  HGBA1C 7.1*     Results for orders placed or performed in visit on 09/28/20  POCT HgB A1C  Result Value Ref Range   Hemoglobin A1C 7.1 (A) 4.0 - 5.6 %      Assessment & Plan:   Problem List Items Addressed This Visit     Type 2 diabetes mellitus with other specified complication ([place])    Improved A1c to 7.1 previously, due for repeat lab A1c Home readings reviewed overall average seems within 7-8 Z6X range Complications - hyperlipidemia, coronary artery disease- increases risk of future cardiovascular complications   Plan:  1. Continue current therapy - metformin XR 500mg  x 2 = 1000mg  BID recently ordered 2. Encourage improved lifestyle - low carb, low sugar diet, reduce portion size, continue  improving regular exercise 3. Check CBG , bring log to next visit for review 4. Continue ASA, ACEi, Statin 5. UTD DM Foot, Ophtho      Relevant Medications   quinapril (ACCUPRIL) 40 MG tablet   simvastatin (ZOCOR) 20 MG tablet   Other Relevant Orders   COMPLETE METABOLIC PANEL WITH GFR   Hemoglobin A1c   Osteoarthritis of knees, bilateral   Hyperlipidemia associated with type 2 diabetes mellitus ([place])    Controlled cholesterol on statin and lifestyle Last lipid panel 03/2020 Due for fasting lipid  Plan: 1. Continue current meds - Simvastatin 20mg  daily 2. Encourage improved lifestyle - low carb/cholesterol, reduce portion size, continue improving regular exercise      Relevant Medications   amLODipine (NORVASC) 10 MG tablet   quinapril (ACCUPRIL) 40 MG tablet   simvastatin (ZOCOR) 20 MG tablet   Other Relevant Orders   COMPLETE METABOLIC PANEL WITH GFR   Lipid panel   Essential hypertension    Well-controlled HTN - Home BP readings  No known complications     Plan:  1. Continue current BP regimen Quinapril 40mg  daily, Amlodipine 10mg  daily 2. Encourage improved lifestyle - low sodium diet, regular exercise 3. Continue monitor BP outside office, bring readings to next visit, if persistently >140/90 or new symptoms notify office sooner      Relevant Medications   amLODipine (NORVASC) 10 MG tablet   quinapril (ACCUPRIL) 40 MG tablet   simvastatin (ZOCOR) 20 MG tablet   Other Relevant Orders   COMPLETE METABOLIC PANEL WITH GFR   CBC with Differential/Platelet   Centrilobular emphysema (HCC)    Chronic Emphysema COPD Stable without flare Has some mild persistent breakthrough symptoms, seems suboptimal control on current intermittent use of maintenance Symbicort  Switch to Breztri sample x 2 for 2 weeks, 2 puff BID, if interested we can order rx  Offer albuterol PRN Tobacco cessation Follow-up if not improving  He has aged out of LDCT screening  age >59 Future  X-ray if interested      Relevant Medications   BREZTRI AEROSPHERE 160-9-4.8 MCG/ACT AERO   Benign non-nodular prostatic hyperplasia without lower urinary tract symptoms   Relevant Orders   PSA   Other Visit Diagnoses     Annual physical exam    -  Primary   Relevant Orders   COMPLETE METABOLIC PANEL WITH GFR   Lipid panel   CBC with Differential/Platelet   Hemoglobin A1c   PSA   Gastroesophageal reflux disease, unspecified whether esophagitis present       Relevant Medications   omeprazole (PRILOSEC) 20 MG capsule       Updated Health Maintenance information Fasting lab due, return as scheduled Encouraged improvement to lifestyle with diet and exercise Goal maintain weight   Meds ordered this encounter  Medications   amLODipine (NORVASC) 10 MG tablet    Sig: Take 1 tablet (10 mg total) by mouth daily.    Dispense:  90 tablet    Refill:  3    For future refills   omeprazole (PRILOSEC) 20 MG capsule    Sig: Take 1 capsule (20 mg total) by mouth daily before breakfast.    Dispense:  90 capsule    Refill:  3    For future refills   quinapril (ACCUPRIL) 40 MG tablet    Sig: Take 1 tablet (40 mg total) by mouth daily.    Dispense:  90 tablet    Refill:  3    For future refills   simvastatin (ZOCOR) 20 MG tablet    Sig: Take 1 tablet (20 mg total) by mouth at bedtime.    Dispense:  90 tablet    Refill:  3    For future refills   BREZTRI AEROSPHERE 160-9-4.8 MCG/ACT AERO    Sig: Inhale 2 puffs into the lungs 2 (two) times daily.    Dispense:  10.7 g    Refill:  0       Follow up plan: Return in about 2 days (around 06/20/2021) for Thurs 11/3 at 845am for fasting lab only.  Nobie Putnam, [facility] 06/18/2021, 1:50 PM

## 2021-06-18 NOTE — Patient Instructions (Addendum)
Thank you for coming to the office today.  Stop Symbicort Try new sample inhaler Breztri triple medication, 2 puffs twice a day If you like the new medicine we can order a new rx for you if the cost is not bad.  Recommend the updated COVID19 booster dose.  DUE for FASTING BLOOD WORK (no food or drink after midnight before the lab appointment, only water or coffee without cream/sugar on the morning of)  SCHEDULE "Lab Only" visit in the morning at the clinic for lab draw in 1-2 days  - Make sure Lab Only appointment is at about 1 week before your next appointment, so that results will be available  For Lab Results, once available within 2-3 days of blood draw, you can can log in to MyChart online to view your results and a brief explanation. Also, we can discuss results at next follow-up visit.   Please schedule a Follow-up Appointment to: Return in about 2 days (around 06/20/2021) for Thurs 11/3 at 845am for fasting lab only.  If you have any other questions or concerns, please feel free to call the office or send a message through [place]. You may also schedule an earlier appointment if necessary.  Additionally, you may be receiving a survey about your experience at our office within a few days to 1 week by e-mail or mail. We value your feedback.  Nobie Putnam, DO [place]

## 2021-06-19 ENCOUNTER — Other Ambulatory Visit: Payer: Self-pay

## 2021-06-19 DIAGNOSIS — I10 Essential (primary) hypertension: Secondary | ICD-10-CM

## 2021-06-19 DIAGNOSIS — E785 Hyperlipidemia, unspecified: Secondary | ICD-10-CM

## 2021-06-19 DIAGNOSIS — N400 Enlarged prostate without lower urinary tract symptoms: Secondary | ICD-10-CM

## 2021-06-19 DIAGNOSIS — Z0000 Encounter for general adult medical examination without abnormal findings: Secondary | ICD-10-CM

## 2021-06-19 DIAGNOSIS — E1169 Type 2 diabetes mellitus with other specified complication: Secondary | ICD-10-CM

## 2021-06-20 ENCOUNTER — Other Ambulatory Visit: Payer: Self-pay

## 2021-06-20 ENCOUNTER — Other Ambulatory Visit: Payer: Medicare Other

## 2021-06-20 DIAGNOSIS — E785 Hyperlipidemia, unspecified: Secondary | ICD-10-CM | POA: Diagnosis not present

## 2021-06-20 DIAGNOSIS — E1169 Type 2 diabetes mellitus with other specified complication: Secondary | ICD-10-CM | POA: Diagnosis not present

## 2021-06-20 DIAGNOSIS — I10 Essential (primary) hypertension: Secondary | ICD-10-CM | POA: Diagnosis not present

## 2021-06-20 DIAGNOSIS — Z0000 Encounter for general adult medical examination without abnormal findings: Secondary | ICD-10-CM | POA: Diagnosis not present

## 2021-06-21 LAB — COMPLETE METABOLIC PANEL WITH GFR
AG Ratio: 1.7 (calc) (ref 1.0–2.5)
ALT: 8 U/L — ABNORMAL LOW (ref 9–46)
AST: 12 U/L (ref 10–35)
Albumin: 4.3 g/dL (ref 3.6–5.1)
Alkaline phosphatase (APISO): 55 U/L (ref 35–144)
BUN: 18 mg/dL (ref 7–25)
CO2: 26 mmol/L (ref 20–32)
Calcium: 9.5 mg/dL (ref 8.6–10.3)
Chloride: 100 mmol/L (ref 98–110)
Creat: 0.87 mg/dL (ref 0.70–1.22)
Globulin: 2.6 g/dL (calc) (ref 1.9–3.7)
Glucose, Bld: 129 mg/dL — ABNORMAL HIGH (ref 65–99)
Potassium: 4 mmol/L (ref 3.5–5.3)
Sodium: 137 mmol/L (ref 135–146)
Total Bilirubin: 0.6 mg/dL (ref 0.2–1.2)
Total Protein: 6.9 g/dL (ref 6.1–8.1)
eGFR: 86 mL/min/{1.73_m2} (ref >=60)

## 2021-06-21 LAB — HEMOGLOBIN A1C
Hgb A1c MFr Bld: 6.6 % of total Hgb — ABNORMAL HIGH (ref <5.7)
Mean Plasma Glucose: 143 mg/dL
eAG (mmol/L): 7.9 mmol/L

## 2021-06-21 LAB — CBC WITH DIFFERENTIAL/PLATELET
Absolute Monocytes: 464 cells/uL (ref 200–950)
Basophils Absolute: 18 cells/uL (ref 0–200)
Basophils Relative: 0.3 %
Eosinophils Absolute: 153 cells/uL (ref 15–500)
Eosinophils Relative: 2.5 %
HCT: 41.2 % (ref 38.5–50.0)
Hemoglobin: 13.3 g/dL (ref 13.2–17.1)
Lymphs Abs: 1684 cells/uL (ref 850–3900)
MCH: 29.3 pg (ref 27.0–33.0)
MCHC: 32.3 g/dL (ref 32.0–36.0)
MCV: 90.7 fL (ref 80.0–100.0)
MPV: 10.3 fL (ref 7.5–12.5)
Monocytes Relative: 7.6 %
Neutro Abs: 3782 cells/uL (ref 1500–7800)
Neutrophils Relative %: 62 %
Platelets: 293 10*3/uL (ref 140–400)
RBC: 4.54 10*6/uL (ref 4.20–5.80)
RDW: 12.9 % (ref 11.0–15.0)
Total Lymphocyte: 27.6 %
WBC: 6.1 10*3/uL (ref 3.8–10.8)

## 2021-06-21 LAB — LIPID PANEL
Cholesterol: 128 mg/dL (ref <200)
HDL: 47 mg/dL (ref >=40)
LDL Cholesterol (Calc): 60 mg/dL (calc)
Non-HDL Cholesterol (Calc): 81 mg/dL (calc) (ref <130)
Total CHOL/HDL Ratio: 2.7 (calc) (ref <5.0)
Triglycerides: 120 mg/dL (ref <150)

## 2021-06-21 LAB — PSA: PSA: 0.24 ng/mL (ref <=4.00)

## 2021-07-23 ENCOUNTER — Other Ambulatory Visit: Payer: Self-pay

## 2021-07-23 DIAGNOSIS — E1169 Type 2 diabetes mellitus with other specified complication: Secondary | ICD-10-CM

## 2021-07-23 MED ORDER — ONETOUCH ULTRA VI STRP: ORAL_STRIP | 12 refills | Status: DC

## 2021-08-27 ENCOUNTER — Other Ambulatory Visit: Payer: Self-pay | Admitting: Family Medicine

## 2021-08-27 DIAGNOSIS — J441 Chronic obstructive pulmonary disease with (acute) exacerbation: Secondary | ICD-10-CM

## 2021-08-27 NOTE — Telephone Encounter (Signed)
Requested by interface surescripts. Discontinued 06/18/21 change in therapy.  Requested Prescriptions  Refused Prescriptions Disp Refills   SYMBICORT 80-4.5 MCG/ACT inhaler [Pharmacy Med Name: Symbicort 80 mcg-4.5 mcg/actuation HFA aerosol inhaler] 10.2 g 1    Sig: INHALE 2 PUFFS BY MOUTH INTO THE LUNGS TWICE A DAY     Pulmonology:  Combination Products Passed - 08/27/2021  8:01 AM      Passed - Valid encounter within last 12 months    Recent Outpatient Visits          2 months ago Annual physical exam   [place], DO   3 months ago Needs flu shot   [facility], Devonne Doughty, DO   10 months ago COPD with acute exacerbation [facility])   [facility], Devonne Doughty, DO   11 months ago Type 2 diabetes mellitus with other specified complication, without long-term current use of insulin [facility])   [facility] Olin Hauser, DO   1 year ago Type 2 diabetes mellitus with other specified complication, without long-term current use of insulin [facility])   [place], Devonne Doughty, DO

## 2021-09-03 ENCOUNTER — Encounter: Payer: Self-pay | Admitting: Family Medicine

## 2021-09-03 ENCOUNTER — Ambulatory Visit (INDEPENDENT_AMBULATORY_CARE_PROVIDER_SITE_OTHER): Payer: Medicare Other | Admitting: Family Medicine

## 2021-09-03 ENCOUNTER — Other Ambulatory Visit: Payer: Self-pay

## 2021-09-03 VITALS — BP 145/70 | HR 94 | Ht 69.0 in | Wt 134.0 lb

## 2021-09-03 DIAGNOSIS — N400 Enlarged prostate without lower urinary tract symptoms: Secondary | ICD-10-CM | POA: Diagnosis not present

## 2021-09-03 DIAGNOSIS — Z789 Other specified health status: Secondary | ICD-10-CM

## 2021-09-03 DIAGNOSIS — N481 Balanitis: Secondary | ICD-10-CM

## 2021-09-03 DIAGNOSIS — J432 Centrilobular emphysema: Secondary | ICD-10-CM

## 2021-09-03 DIAGNOSIS — E1169 Type 2 diabetes mellitus with other specified complication: Secondary | ICD-10-CM | POA: Diagnosis not present

## 2021-09-03 DIAGNOSIS — E785 Hyperlipidemia, unspecified: Secondary | ICD-10-CM | POA: Diagnosis not present

## 2021-09-03 DIAGNOSIS — I10 Essential (primary) hypertension: Secondary | ICD-10-CM

## 2021-09-03 MED ORDER — FLUCONAZOLE 150 MG PO TABS: ORAL_TABLET | ORAL | 0 refills | Status: DC

## 2021-09-03 NOTE — Progress Notes (Signed)
Subjective:    Patient ID: Stephen Savage, male    DOB: [birth year], 81 y.o.   MRN: [id number]  Stephen Savage is a 81 y.o. male presenting on 09/03/2021 for No chief complaint on file.   HPI  Last visit 06/2021 for Annual Physical  Centrilobular Emphysema Tobacco Abuse Chronic problem, active smoker 1ppd >60 years. Not ready to quit. He has some occasional breathing problem dyspnea or cough. On Breztri   CHRONIC HTN: Reports no concerns, has controlled BP Current Meds - Quinapril 82m daily, Amlodipine 142mdaily,    Reports good compliance, took meds today. Tolerating well, w/o complaints. Denies CP, dyspnea, HA, edema, dizziness / lightheadedness   CHRONIC DM, Type 2: Last lab shows A1c 6.6 (06/2021) previously >7 CBGs: Avg 120-150 in AM and 200 after dinner PM. Checks CBGs twice daily Meds: Metformin XR 50058m 2 = 1000m82mD Reports good compliance. Tolerating well w/o side-effects Currently on ACEi Controlled on Statin Lifestyle: - Diet (Improved)  - Exercise (Active) DM Eye Exam  Eye 04/17/20 Denies hypoglycemia, polyuria, visual changes, numbness or tingling.   HYPERLIPIDEMIA: - Reports no concerns. Last lipid panel 06/2021, controlled  - Currently taking Simvastatin 20mg54mlerating well without side effects or myalgias  Penile Skin Irritation / Swelling He is uncircumcised. He said onset 1 week ago has developed some swelling and redness and soreness behind head of penis. Some discharge in this area. No other sexual partners or contacts. No prior issue before. His brother has had circumcision later in life. Asks to see urologist  Bilateral Knee Pain chronic, Osteoarthritis Back Pain, lumbar disc [facility] Taking Tylenol   Health Maintenance: Prostate CA Screening: Prior PSA / DRE reported normal. Last PSA 0.24 (06/2021) Currently BPH. No known family history of prostate CA.   Depression screen [facility]  04/20/2020 04/10/2020  Decreased Interest 0 0 0  Down, Depressed, Hopeless 0 0 0  PHQ - 2 Score 0 0 0    Social History   Tobacco Use   Smoking status: Every Day    Packs/day: 1.00    Years: 60.00    Pack years: 60.00    Types: Cigarettes   Smokeless tobacco: Current  Vaping Use   Vaping Use: Former  Substance Use Topics   Alcohol use: Not Currently   Drug use: Never    Review of Systems Per HPI unless specifically indicated above     Objective:    BP (!) 145/70    Pulse 94    Ht '5\' 9"'  (1.753 m)    Wt 134 lb (60.8 kg)    SpO2 97%    BMI 19.79 kg/m   Wt Readings from Last 3 Encounters:  09/03/21 134 lb (60.8 kg)  06/18/21 134 lb 9.6 oz (61.1 kg)  05/14/21 139 lb (63 kg)    Physical Exam Vitals and nursing note reviewed.  Constitutional:      General: He is not in acute distress.    Appearance: Normal appearance. He is well-developed. He is not diaphoretic.     Comments: Well-appearing, comfortable, cooperative  HENT:     Head: Normocephalic and atraumatic.  Eyes:     General:        Right eye: No discharge.        Left eye: No discharge.     Conjunctiva/sclera: Conjunctivae normal.  Cardiovascular:     Rate and Rhythm: Normal rate.  Pulmonary:     Effort: Pulmonary effort  is normal.  Genitourinary:    Comments: Uncircumcised penis with area of slight swelling of foreskin but easily retracts over head of penis without issue, has some slight discharge and skin sore identified. Skin:    General: Skin is warm and dry.     Findings: No erythema or rash.  Neurological:     Mental Status: He is alert and oriented to person, place, and time.  Psychiatric:        Mood and Affect: Mood normal.        Behavior: Behavior normal.        Thought Content: Thought content normal.     Comments: Well groomed, good eye contact, normal speech and thoughts   Results for orders placed or performed in visit on 06/19/21  PSA  Result Value Ref Range   PSA 0.24 < OR = 4.00 ng/mL   Hemoglobin A1c  Result Value Ref Range   Hgb A1c MFr Bld 6.6 (H) <5.7 % of total Hgb   Mean Plasma Glucose 143 mg/dL   eAG (mmol/L) 7.9 mmol/L  CBC with Differential/Platelet  Result Value Ref Range   WBC 6.1 3.8 - 10.8 Thousand/uL   RBC 4.54 4.20 - 5.80 Million/uL   Hemoglobin 13.3 13.2 - 17.1 g/dL   HCT 41.2 38.5 - 50.0 %   MCV 90.7 80.0 - 100.0 fL   MCH 29.3 27.0 - 33.0 pg   MCHC 32.3 32.0 - 36.0 g/dL   RDW 12.9 11.0 - 15.0 %   Platelets 293 140 - 400 Thousand/uL   MPV 10.3 7.5 - 12.5 fL   Neutro Abs 3,782 1,500 - 7,800 cells/uL   Lymphs Abs 1,684 850 - 3,900 cells/uL   Absolute Monocytes 464 200 - 950 cells/uL   Eosinophils Absolute 153 15 - 500 cells/uL   Basophils Absolute 18 0 - 200 cells/uL   Neutrophils Relative % 62 %   Total Lymphocyte 27.6 %   Monocytes Relative 7.6 %   Eosinophils Relative 2.5 %   Basophils Relative 0.3 %  Lipid panel  Result Value Ref Range   Cholesterol 128 <200 mg/dL   HDL 47 > OR = 40 mg/dL   Triglycerides 120 <150 mg/dL   LDL Cholesterol (Calc) 60 mg/dL (calc)   Total CHOL/HDL Ratio 2.7 <5.0 (calc)   Non-HDL Cholesterol (Calc) 81 <130 mg/dL (calc)  COMPLETE METABOLIC PANEL WITH GFR  Result Value Ref Range   Glucose, Bld 129 (H) 65 - 99 mg/dL   BUN 18 7 - 25 mg/dL   Creat 0.87 0.70 - 1.22 mg/dL   eGFR 86 > OR = 60 mL/min/1.45m   BUN/Creatinine Ratio NOT APPLICABLE 6 - 22 (calc)   Sodium 137 135 - 146 mmol/L   Potassium 4.0 3.5 - 5.3 mmol/L   Chloride 100 98 - 110 mmol/L   CO2 26 20 - 32 mmol/L   Calcium 9.5 8.6 - 10.3 mg/dL   Total Protein 6.9 6.1 - 8.1 g/dL   Albumin 4.3 3.6 - 5.1 g/dL   Globulin 2.6 1.9 - 3.7 g/dL (calc)   AG Ratio 1.7 1.0 - 2.5 (calc)   Total Bilirubin 0.6 0.2 - 1.2 mg/dL   Alkaline phosphatase (APISO) 55 35 - 144 U/L   AST 12 10 - 35 U/L   ALT 8 (L) 9 - 46 U/L      Assessment & Plan:   Problem List Items Addressed This Visit     Type 2 diabetes mellitus with other specified complication ([place]  Hyperlipidemia associated with type 2 diabetes mellitus ([place])   Essential hypertension   Centrilobular emphysema (HCC)   Relevant Medications   SYMBICORT 80-4.5 MCG/ACT inhaler   Benign non-nodular prostatic hyperplasia without lower urinary tract symptoms   Other Visit Diagnoses     Uncircumcised male    -  Primary   Relevant Orders   Ambulatory referral to Urology   Balanitis circinata       Relevant Medications   fluconazole (DIFLUCAN) 150 MG tablet   Other Relevant Orders   Ambulatory referral to Urology       DM2 Improved control  HTN controlled on meds  COPD Continues Breztri  Reviewed lab results once more today  Balanitis Uncircumcised Risk w Diabetes Will treat with oral anti fungal yeast medication for now, and refer to Urology for consult on circumcision if indicated.  Orders Placed This Encounter  Procedures   Ambulatory referral to Urology    Referral Priority:   Routine    Referral Type:   Consultation    Referral Reason:   Specialty Services Required    Requested Specialty:   Urology    Number of Visits Requested:   1     Meds ordered this encounter  Medications   fluconazole (DIFLUCAN) 150 MG tablet    Sig: Take one tablet by mouth on Day 1. Repeat dose 2nd tablet on Day 3.    Dispense:  2 tablet    Refill:  0      Follow up plan: Return if symptoms worsen or fail to improve.   Nobie Putnam, [facility] 09/03/2021, 9:18 AM

## 2021-09-03 NOTE — Patient Instructions (Addendum)
Thank you for coming to the office today.  Recent Labs    09/28/20 0908 06/20/21 0830  HGBA1C 7.1* 6.6*   Cholesterol, kidney liver blood counts lab results all good. Keep up the great work  PSA 0.24 normal range.  Referral to Urology they will call you and discuss circumcision procedure  [place] -[address] [place],  [place]  [address] Phone: [phone]  Start Diflucan (fluconazole) for yeast infection around head of penis. Take 1 pill then skip day 2 then take final pill on Day 3.  If it comes back or does not resolve, please let me know  Please schedule a Follow-up Appointment to: Return if symptoms worsen or fail to improve.  If you have any other questions or concerns, please feel free to call the office or send a message through [place]. You may also schedule an earlier appointment if necessary.  Additionally, you may be receiving a survey about your experience at our office within a few days to 1 week by e-mail or mail. We value your feedback.  Nobie Putnam, DO [place]

## 2021-10-01 ENCOUNTER — Ambulatory Visit: Payer: Self-pay

## 2021-10-01 DIAGNOSIS — I10 Essential (primary) hypertension: Secondary | ICD-10-CM

## 2021-10-01 MED ORDER — LISINOPRIL 40 MG PO TABS: 40.0000 mg | ORAL_TABLET | Freq: Every day | ORAL | 3 refills | Status: DC

## 2021-10-01 NOTE — Addendum Note (Signed)
Addended by: Olin Hauser on: 10/01/2021 03:23 PM   Modules accepted: Orders

## 2021-10-01 NOTE — Telephone Encounter (Signed)
Antony Madura, [facility] called and is requesting different medication due to Quinapril is out of stock, on backorder with no release date.    Summary: medication change requested not available   Nicky with the pharmacy called in asking Dr Raliegh Ip to please change medication quinapril (ACCUPRIL) 40 MG tablet since it is not available right now. Asking for a call back please at Ph# [phone]      Reason for Disposition  [1] Caller has URGENT medicine question about med that PCP or specialist prescribed AND [2] triager unable to answer question  Answer Assessment - Initial Assessment Questions 1. NAME of MEDICATION: "What medicine are you calling about?"     quinapril 2. QUESTION: "What is your question?" (e.g., double dose of medicine, side effect)     Medication is on backorder with no release date 3. PRESCRIBING HCP: "Who prescribed it?" Reason: if prescribed by specialist, call should be referred to that group.     Dr. Parks Ranger  Protocols used: Medication Question Call-A-AH

## 2021-10-01 NOTE — Telephone Encounter (Signed)
Please notify them that we can switch this med to Lisinopril 40mg , new rx sent to them Escript to [facility].  Stephen Savage, [facility] 10/01/2021, 3:23 PM

## 2021-10-01 NOTE — Progress Notes (Signed)
10/02/21 9:19 AM   Dulcy Fanny 02-20-1941 [id number]  Referring provider:  Olin Hauser, DO [address] [place],  [place] [address] No chief complaint on file.    HPI: Stephen Savage is a 81 y.o.male who presents today for further evaluation of balanitis.   He is uncircumcised and has a personal history of diabetes.   His most recent PSA on 06/20/2021 was 0.24 with a previous PSA of 0.2 in 2021. His most recent HgBA1c WAS 6.6 in 06/2021.   He was seen by his PCP, Nobie Putnam, [place], on 09/03/2021 with redness and irritation around the head of his penis. He was treated with oral anti fungal yeast medication.   He reports that he has had soreness on his penis before but he has not had history of him being unable to retract is foreskin. He used neosporin which further irritated him.   PMH: Past Medical History:  Diagnosis Date   COPD (chronic obstructive pulmonary disease) ([place])    Hyperlipidemia    Hypertension    Knee pain     Surgical History: No past surgical history on file.  Home Medications:  Allergies as of 10/02/2021       Reactions   Codeine Nausea Only, Nausea And Vomiting        Medication List        Accurate as of October 02, 2021  9:19 AM. If you have any questions, ask your nurse or doctor.          STOP taking these medications    fluconazole 150 MG tablet Commonly known as: DIFLUCAN Stopped by: Hollice Espy, MD       TAKE these medications    amLODipine 10 MG tablet Commonly known as: NORVASC Take 1 tablet (10 mg total) by mouth daily.   aspirin EC 81 MG tablet Take 81 mg by mouth daily. Swallow whole.   baclofen 10 MG tablet Commonly known as: LIORESAL Take 0.5-1 tablets (5-10 mg total) by mouth 3 (three) times daily as needed for muscle spasms.   Breztri Aerosphere 160-9-4.8 MCG/ACT Aero Generic drug: Budeson-Glycopyrrol-Formoterol Inhale 2 puffs into the lungs 2 (two) times daily.   lisinopril  40 MG tablet Commonly known as: ZESTRIL Take 1 tablet (40 mg total) by mouth daily.   metFORMIN 500 MG 24 hr tablet Commonly known as: GLUCOPHAGE-XR Take 2 tablets (1,000 mg total) by mouth 2 (two) times daily with a meal.   nystatin-triamcinolone ointment Commonly known as: MYCOLOG Apply 1 application topically 2 (two) times daily. Started by: Hollice Espy, MD   omeprazole 20 MG capsule Commonly known as: PRILOSEC Take 1 capsule (20 mg total) by mouth daily before breakfast.   OneTouch Ultra test strip Generic drug: glucose blood Use to check blood sugar twice daily   simvastatin 20 MG tablet Commonly known as: ZOCOR Take 1 tablet (20 mg total) by mouth at bedtime.   Symbicort 80-4.5 MCG/ACT inhaler Generic drug: budesonide-formoterol Inhale 2 puffs into the lungs 2 (two) times daily.        Allergies:  Allergies  Allergen Reactions   Codeine Nausea Only and Nausea And Vomiting    Family History: Family History  Problem Relation Age of Onset   Heart disease Mother 6   Heart attack Mother    Diabetes Father 32    Social History:  reports that he has been smoking cigarettes. He has a 60.00 pack-year smoking history. He uses smokeless tobacco. He reports that he  does not currently use alcohol. He reports that he does not use drugs.   Physical Exam: BP (!) 165/91    Pulse 94    Ht 5\' 9"  (1.753 m)    Wt 134 lb (60.8 kg)    BMI 19.79 kg/m   Constitutional:  Alert and oriented, No acute distress. HEENT: [place] AT, moist mucus membranes.  Trachea midline, no masses. Cardiovascular: No clubbing, cyanosis, or edema. Respiratory: Normal respiratory effort, no increased work of breathing. GU: No CVA tenderness mildly irritated foreskin that was easily retracted, additional mild erythema on coronal margin Skin: No rashes, bruises or suspicious lesions. Neurologic: Grossly intact, no focal deficits, moving all 4 extremities. Psychiatric: Normal mood and  affect.  Laboratory Data: Lab Results  Component Value Date   CREATININE 0.87 06/20/2021   Lab Results  Component Value Date   PSA 0.24 06/20/2021   PSA 0.2 04/05/2020   Lab Results  Component Value Date   HGBA1C 6.6 (H) 06/20/2021    Assessment & Plan:    Balanoposthitis  - Will treat him with Mycolog, there is no further indication for circumcision at this time, anticipate full resolution with topical ointment.  - Advised him to not use neosporin as it can further irritate the area.  - Discussed diabetes  as is a risk factor.  Importance of excellent glucose control was discussed.  I,Kailey Littlejohn,acting as a Education administrator for Hollice Espy, MD.,have documented all relevant documentation on the behalf of Hollice Espy, MD,as directed by  Hollice Espy, MD while in the presence of Hollice Espy, MD.  I have reviewed the above documentation for accuracy and completeness, and I agree with the above.   Hollice Espy, MD    [facility] [address], [place], [place] [address] [phone]

## 2021-10-02 ENCOUNTER — Telehealth: Payer: Self-pay

## 2021-10-02 ENCOUNTER — Ambulatory Visit: Payer: Medicare Other | Admitting: Urology

## 2021-10-02 ENCOUNTER — Other Ambulatory Visit: Payer: Self-pay

## 2021-10-02 ENCOUNTER — Encounter: Payer: Self-pay | Admitting: Urology

## 2021-10-02 VITALS — BP 165/91 | HR 94 | Ht 69.0 in | Wt 134.0 lb

## 2021-10-02 DIAGNOSIS — N476 Balanoposthitis: Secondary | ICD-10-CM | POA: Diagnosis not present

## 2021-10-02 MED ORDER — NYSTATIN-TRIAMCINOLONE 100000-0.1 UNIT/GM-% EX OINT: 1.0000 "application " | TOPICAL_OINTMENT | Freq: Two times a day (BID) | CUTANEOUS | 0 refills | Status: DC

## 2021-10-02 NOTE — Telephone Encounter (Signed)
Pt said he has not been in the hospital, except to go to a urology appointment. He said "he didn't understand why" about the phone call and hung up.

## 2021-10-02 NOTE — Telephone Encounter (Signed)
Pt said he has not been in the hospital and couldn't understand why I was calling and thought he had been. He said he only went to see a urologist for an appointment. He then hung up.

## 2021-10-04 ENCOUNTER — Other Ambulatory Visit: Payer: Self-pay | Admitting: *Deleted

## 2021-10-04 ENCOUNTER — Other Ambulatory Visit: Payer: Self-pay | Admitting: Family Medicine

## 2021-10-04 DIAGNOSIS — J441 Chronic obstructive pulmonary disease with (acute) exacerbation: Secondary | ICD-10-CM

## 2021-10-04 MED ORDER — NYSTATIN-TRIAMCINOLONE 100000-0.1 UNIT/GM-% EX OINT: 1.0000 "application " | TOPICAL_OINTMENT | Freq: Two times a day (BID) | CUTANEOUS | 0 refills | Status: DC

## 2021-10-04 NOTE — Telephone Encounter (Signed)
Pt calling in stating that Mycolog cream at [facility] drug is to expensive. I advised pt that it's cheaper at [facility] per [facility]. Pt want rx sent to [facility]. rx sent to pharmacy by e-script

## 2021-10-04 NOTE — Telephone Encounter (Signed)
Requested medication (s) are due for refill today: yes  Requested medication (s) are on the active medication list: yes  Last refill:  08/07/21  Future visit scheduled: no  Notes to clinic:  Historical Provider, please assess.       Requested Prescriptions  Pending Prescriptions Disp Refills   SYMBICORT 80-4.5 MCG/ACT inhaler [Pharmacy Med Name: Symbicort 80 mcg-4.5 mcg/actuation HFA aerosol inhaler] 10.2 g 1    Sig: INHALE 2 PUFFS BY MOUTH INTO THE LUNGS TWICE A DAY     Pulmonology:  [employer] Passed - 10/04/2021 11:12 AM      Passed - Valid encounter within last 12 months    Recent Outpatient Visits           1 month ago Uncircumcised male   [place], DO   3 months ago Annual physical exam   [place], DO   4 months ago Needs flu shot   [place], DO   11 months ago COPD with acute exacerbation [facility])   [facility] Olin Hauser, DO   1 year ago Type 2 diabetes mellitus with other specified complication, without long-term current use of insulin [facility])   [place], Devonne Doughty, DO

## 2021-10-07 NOTE — Telephone Encounter (Signed)
Pt came by office and I gave him goodrx card.

## 2021-10-08 NOTE — Telephone Encounter (Signed)
Please contact patient.  I gave him sample Judithann Sauger recently  Now he is asking for refill Symbicort  If the Judithann Sauger was helpful, it is a better medicine and I would prefer to order that  Let me know what he says. If the cost is too high on breztri or he prefers back on symbicort then I can order whatever he chooses  Nobie Putnam, [facility] 10/08/2021, 1:22 PM

## 2021-12-30 ENCOUNTER — Telehealth: Payer: Self-pay

## 2021-12-30 ENCOUNTER — Other Ambulatory Visit: Payer: Self-pay

## 2021-12-30 MED ORDER — SYMBICORT 80-4.5 MCG/ACT IN AERO: 2.0000 | INHALATION_SPRAY | Freq: Two times a day (BID) | RESPIRATORY_TRACT | 1 refills | Status: DC

## 2021-12-30 NOTE — Telephone Encounter (Signed)
Pt is requesting a refill on Symbicort called into [facility] [address]

## 2022-01-09 DIAGNOSIS — L989 Disorder of the skin and subcutaneous tissue, unspecified: Secondary | ICD-10-CM | POA: Diagnosis not present

## 2022-01-15 DIAGNOSIS — L308 Other specified dermatitis: Secondary | ICD-10-CM | POA: Diagnosis not present

## 2022-01-15 DIAGNOSIS — R21 Rash and other nonspecific skin eruption: Secondary | ICD-10-CM | POA: Diagnosis not present

## 2022-01-20 DIAGNOSIS — E1165 Type 2 diabetes mellitus with hyperglycemia: Secondary | ICD-10-CM | POA: Diagnosis not present

## 2022-01-20 DIAGNOSIS — M48062 Spinal stenosis, lumbar region with neurogenic claudication: Secondary | ICD-10-CM | POA: Diagnosis not present

## 2022-02-10 ENCOUNTER — Other Ambulatory Visit: Payer: Self-pay | Admitting: Physical Medicine & Rehabilitation

## 2022-02-10 DIAGNOSIS — M48062 Spinal stenosis, lumbar region with neurogenic claudication: Secondary | ICD-10-CM | POA: Diagnosis not present

## 2022-02-10 DIAGNOSIS — G8929 Other chronic pain: Secondary | ICD-10-CM | POA: Diagnosis not present

## 2022-02-10 DIAGNOSIS — M5442 Lumbago with sciatica, left side: Secondary | ICD-10-CM | POA: Diagnosis not present

## 2022-02-20 ENCOUNTER — Ambulatory Visit
Admission: RE | Admit: 2022-02-20 | Discharge: 2022-02-20 | Disposition: A | Discharge status: Home / Self Care | Payer: Medicare Other | Source: Ambulatory Visit | Attending: Physical Medicine & Rehabilitation | Admitting: Physical Medicine & Rehabilitation

## 2022-02-20 DIAGNOSIS — M48062 Spinal stenosis, lumbar region with neurogenic claudication: Secondary | ICD-10-CM | POA: Insufficient documentation

## 2022-02-20 DIAGNOSIS — M47816 Spondylosis without myelopathy or radiculopathy, lumbar region: Secondary | ICD-10-CM | POA: Diagnosis not present

## 2022-02-20 DIAGNOSIS — M5126 Other intervertebral disc displacement, lumbar region: Secondary | ICD-10-CM | POA: Diagnosis not present

## 2022-02-20 DIAGNOSIS — M5442 Lumbago with sciatica, left side: Secondary | ICD-10-CM | POA: Insufficient documentation

## 2022-02-20 DIAGNOSIS — G8929 Other chronic pain: Secondary | ICD-10-CM | POA: Insufficient documentation

## 2022-02-24 ENCOUNTER — Other Ambulatory Visit: Payer: Self-pay | Admitting: Family Medicine

## 2022-02-24 ENCOUNTER — Telehealth: Payer: Self-pay

## 2022-02-24 DIAGNOSIS — N281 Cyst of kidney, acquired: Secondary | ICD-10-CM

## 2022-02-24 DIAGNOSIS — R93429 Abnormal radiologic findings on diagnostic imaging of unspecified kidney: Secondary | ICD-10-CM

## 2022-02-24 DIAGNOSIS — N2889 Other specified disorders of kidney and ureter: Secondary | ICD-10-CM

## 2022-02-24 NOTE — Telephone Encounter (Signed)
I was able to with speak with the patient, Stephen Savage. He is aware of the incidental finding on his MRI that was done by Dr. Alba Destine at [facility] on his back. There was a renal cyst found that may require further imaging. Stephen Savage let me know that he would like to proceed with an MRI to get a better look at his kidneys but he is concerned with the cost of an MRI soon after the other. I told him that Dr. Raliegh Ip will order this and that scheduling would be contacting him to schedule. He said that if his copay is above the $100 he paid for the previous MRI, he will cancel it and let us know. If he proceeds with the MRI, we will follow up with him in office to go over the results and discuss treatment options. I have spoken with Dr. Alba Destine' CMA, Aurelio Brash and let them know our plan to proceed as an FYI.

## 2022-02-26 DIAGNOSIS — L308 Other specified dermatitis: Secondary | ICD-10-CM | POA: Diagnosis not present

## 2022-02-26 DIAGNOSIS — R21 Rash and other nonspecific skin eruption: Secondary | ICD-10-CM | POA: Diagnosis not present

## 2022-03-07 ENCOUNTER — Ambulatory Visit
Admission: RE | Admit: 2022-03-07 | Discharge: 2022-03-07 | Disposition: A | Discharge status: Home / Self Care | Payer: Medicare Other | Source: Ambulatory Visit | Attending: Family Medicine | Admitting: Family Medicine

## 2022-03-07 DIAGNOSIS — N281 Cyst of kidney, acquired: Secondary | ICD-10-CM | POA: Diagnosis not present

## 2022-03-07 DIAGNOSIS — K7689 Other specified diseases of liver: Secondary | ICD-10-CM | POA: Diagnosis not present

## 2022-03-07 DIAGNOSIS — N2889 Other specified disorders of kidney and ureter: Secondary | ICD-10-CM | POA: Diagnosis not present

## 2022-03-07 DIAGNOSIS — D3500 Benign neoplasm of unspecified adrenal gland: Secondary | ICD-10-CM | POA: Diagnosis not present

## 2022-03-07 MED ORDER — GADOBUTROL 1 MMOL/ML IV SOLN
6.0000 mL | Freq: Once | INTRAVENOUS | Status: AC | PRN
  Administered 2022-03-07: 6 mL via INTRAVENOUS

## 2022-03-07 NOTE — Addendum Note (Signed)
Addended by: Olin Hauser on: 03/07/2022 06:12 PM   Modules accepted: Orders

## 2022-03-10 NOTE — Progress Notes (Signed)
03/11/22 12:34 PM   Stephen Savage 18, 1942 [id number]  Referring provider:  Olin Hauser, DO [address] [place],  [place] [address] No chief complaint on file.   HPI: Stephen Savage is a 81 y.o.male with a personal  who presents today for further evaluation of abnormal MRI findings.  He is previously known to urology clinic for personal history of phimosis.  He underwent a MR lumbar spine wo contrast on 02/20/2022 for further evaluation of left-sided low back pain extending into leg. It visualized indeterminate Indeterminate cystic lesion in the interpolar region of the right kidney. He underwent an MRI of abdomen on 03/07/2022 to further evaluate that visualized a 2.9 cm diffusely enhancing mass in the upper pole right kidney, right renal cyst, tiny likely cyst in the right hepatic lobe, and a small right adrenal adenoma.   He denies any flank pain other than his chronic back pain.  No hematuria.  He reports that he is on aspirin.  PMH: Past Medical History:  Diagnosis Date   COPD (chronic obstructive pulmonary disease) ([place])    Hyperlipidemia    Hypertension    Knee pain     Surgical History: No past surgical history on file.  Home Medications:  Allergies as of 03/11/2022       Reactions   Codeine Nausea Only, Nausea And Vomiting        Medication List        Accurate as of March 11, 2022 12:34 PM. If you have any questions, ask your nurse or doctor.          amLODipine 10 MG tablet Commonly known as: NORVASC Take 1 tablet (10 mg total) by mouth daily.   aspirin EC 81 MG tablet Take 81 mg by mouth daily. Swallow whole.   baclofen 10 MG tablet Commonly known as: LIORESAL Take 0.5-1 tablets (5-10 mg total) by mouth 3 (three) times daily as needed for muscle spasms.   Breztri Aerosphere 160-9-4.8 MCG/ACT Aero Generic drug: Budeson-Glycopyrrol-Formoterol Inhale 2 puffs into the lungs 2 (two) times daily.   lisinopril 40 MG  tablet Commonly known as: ZESTRIL Take 1 tablet (40 mg total) by mouth daily.   metFORMIN 500 MG 24 hr tablet Commonly known as: GLUCOPHAGE-XR Take 2 tablets (1,000 mg total) by mouth 2 (two) times daily with a meal.   nystatin-triamcinolone ointment Commonly known as: MYCOLOG Apply 1 application topically 2 (two) times daily.   omeprazole 20 MG capsule Commonly known as: PRILOSEC Take 1 capsule (20 mg total) by mouth daily before breakfast.   OneTouch Ultra test strip Generic drug: glucose blood Use to check blood sugar twice daily   simvastatin 20 MG tablet Commonly known as: ZOCOR Take 1 tablet (20 mg total) by mouth at bedtime.   Symbicort 80-4.5 MCG/ACT inhaler Generic drug: budesonide-formoterol Inhale 2 puffs into the lungs 2 (two) times daily.        Allergies:  Allergies  Allergen Reactions   Codeine Nausea Only and Nausea And Vomiting    Family History: Family History  Problem Relation Age of Onset   Heart disease Mother 63   Heart attack Mother    Diabetes Father 34    Social History:  reports that he has been smoking cigarettes. He has a 60.00 pack-year smoking history. He uses smokeless tobacco. He reports that he does not currently use alcohol. He reports that he does not use drugs.   Physical Exam: BP 138/88   Pulse 78  Ht '5\' 9"'$  (1.753 m)   Wt 134 lb (60.8 kg)   BMI 19.79 kg/m   Constitutional:  Alert and oriented, No acute distress.  Accompanied by his wife today.  Very hard of hearing. HEENT: [place] AT, moist mucus membranes.  Trachea midline, no masses. Cardiovascular: No clubbing, cyanosis, or edema. Respiratory: Normal respiratory effort, no increased work of breathing. Skin: No rashes, bruises or suspicious lesions. Neurologic: Grossly intact, no focal deficits, moving all 4 extremities. Psychiatric: Normal mood and affect.  Laboratory Data: Lab Results  Component Value Date   CREATININE 0.87 06/20/2021   Lab Results  Component  Value Date   HGBA1C 6.6 (H) 06/20/2021    Pertinent Imaging: CLINICAL DATA:  Indeterminate renal mass   EXAM: MRI ABDOMEN WITHOUT AND WITH CONTRAST   TECHNIQUE: Multiplanar multisequence MR imaging of the abdomen was performed both before and after the administration of intravenous contrast.   CONTRAST:  47m GADAVIST GADOBUTROL 1 MMOL/ML IV SOLN   COMPARISON:  MRI lumbar spine 02/20/2022   FINDINGS: Lower chest: No acute findings.   Hepatobiliary: Liver is normal in size and contour. 3 mm nonenhancing focus at the posterior right hepatic lobe segment 7 likely represents a cyst. Gallbladder appears normal. No biliary ductal dilatation.   Pancreas:  Atrophic with no mass or ductal dilatation identified.   Spleen:  Within normal limits in size and appearance.   Adrenals/Urinary Tract: 1.5 cm right adrenal gland nodule which demonstrates signal dropout on out of phase T1, consistent with benign adenoma, no follow-up required. Right adrenal gland is normal. There is a partially exophytic mass at the posterior upper pole right kidney measuring 2.8 x 2.9 cm which is hyperintense T2, hypointense T1 and demonstrates diffuse postcontrast enhancement. There is also a 1.8 cm simple cyst in the lower pole right kidney. No hydronephrosis.   Stomach/Bowel: Visualized portions within the abdomen are unremarkable.   Vascular/Lymphatic: No pathologically enlarged lymph nodes identified. No abdominal aortic aneurysm demonstrated. Renal veins are unremarkable.   Other:  No ascites.   Musculoskeletal: Degenerative changes in the spine. No suspicious bony lesions identified.   IMPRESSION: 1. 2.9 cm diffusely enhancing mass in the upper pole right kidney, which should be considered renal cell carcinoma until proven otherwise. No lymphadenopathy visualized. 2. Right renal cyst. 3. Tiny likely cyst in the right hepatic lobe. 4. Small right adrenal adenoma, no follow-up required.      Electronically Signed   By: DOfilia NeasM.D.   On: 03/07/2022 10:05   I have personally reviewed the images and agree with radiologist interpretation.     Assessment & Plan:   Right renal Mass A solid renal mass raises the suspicion of primary renal malignancy.  We discussed this in detail and in regards to the spectrum of renal masses which includes cysts (pure cysts are considered benign), solid masses and everything in between. The risk of metastasis increases as the size of solid renal mass increases. In general, it is believed that the risk of metastasis for renal masses less than 3-4 cm is small (up to approximately 5%) based mainly on large retrospective studies. In some cases and especially in patients of older age and multiple comorbidities a surveillance approach may be appropriate. The treatment of solid renal masses includes: surveillance, cryoablation (percutaneous and laparoscopic) in addition to partial and complete nephrectomy (each with option of laparoscopic, robotic and open depending on appropriateness). Furthermore, nephrectomy appears to be an independent risk factor for the development of chronic  kidney disease suggesting that nephron sparing approaches should be implored whenever feasible. We reviewed these options in context of the patients current situation as well as the pros and cons of each..   - He would like to proceed with surveillance rather than surgical/procedural at this time.  We did review risk and benefits of each in detail.  If there is interval growth, he may consider percutaneous intervention but is concerned about bleeding and other complications.  -. Will plan for MRI of abdomen and visit in 6 months.    Conley Rolls as a Education administrator for Hollice Espy, MD.,have documented all relevant documentation on the behalf of Hollice Espy, MD,as directed by  Hollice Espy, MD while in the presence of Hollice Espy, MD.  I have reviewed the  above documentation for accuracy and completeness, and I agree with the above.   Hollice Espy, MD   [facility] [address], [place], [place] [address] [phone]  I spent 32 total minutes on the day of the encounter including pre-visit review of the medical record, face-to-face time with the patient, and post visit ordering of labs/imaging/tests.

## 2022-03-10 NOTE — Progress Notes (Incomplete)
03/10/22 11:49 PM   Stephen Savage 08-25-1940 [id number]  Referring provider:  Olin Hauser, DO [address] [place],  [place] [address] No chief complaint on file.     HPI: Stephen Savage is a 81 y.o.male with a personal  who presents today for further evaluation of abnormal MRI findings.   He underwent a MR lumbar spine wo contrast on 02/20/2022 for further evaluation of left-sided low back pain extending into leg. It visualized     PMH: Past Medical History:  Diagnosis Date  . COPD (chronic obstructive pulmonary disease) ([place])   . Hyperlipidemia   . Hypertension   . Knee pain     Surgical History: No past surgical history on file.  Home Medications:  Allergies as of 03/11/2022       Reactions   Codeine Nausea Only, Nausea And Vomiting        Medication List        Accurate as of March 10, 2022 11:49 PM. If you have any questions, ask your nurse or doctor.          amLODipine 10 MG tablet Commonly known as: NORVASC Take 1 tablet (10 mg total) by mouth daily.   aspirin EC 81 MG tablet Take 81 mg by mouth daily. Swallow whole.   baclofen 10 MG tablet Commonly known as: LIORESAL Take 0.5-1 tablets (5-10 mg total) by mouth 3 (three) times daily as needed for muscle spasms.   Breztri Aerosphere 160-9-4.8 MCG/ACT Aero Generic drug: Budeson-Glycopyrrol-Formoterol Inhale 2 puffs into the lungs 2 (two) times daily.   lisinopril 40 MG tablet Commonly known as: ZESTRIL Take 1 tablet (40 mg total) by mouth daily.   metFORMIN 500 MG 24 hr tablet Commonly known as: GLUCOPHAGE-XR Take 2 tablets (1,000 mg total) by mouth 2 (two) times daily with a meal.   nystatin-triamcinolone ointment Commonly known as: MYCOLOG Apply 1 application topically 2 (two) times daily.   omeprazole 20 MG capsule Commonly known as: PRILOSEC Take 1 capsule (20 mg total) by mouth daily before breakfast.   OneTouch Ultra test strip Generic drug: glucose  blood Use to check blood sugar twice daily   simvastatin 20 MG tablet Commonly known as: ZOCOR Take 1 tablet (20 mg total) by mouth at bedtime.   Symbicort 80-4.5 MCG/ACT inhaler Generic drug: budesonide-formoterol Inhale 2 puffs into the lungs 2 (two) times daily.        Allergies:  Allergies  Allergen Reactions  . Codeine Nausea Only and Nausea And Vomiting    Family History: Family History  Problem Relation Age of Onset  . Heart disease Mother 33  . Heart attack Mother   . Diabetes Father 47    Social History:  reports that he has been smoking cigarettes. He has a 60.00 pack-year smoking history. He uses smokeless tobacco. He reports that he does not currently use alcohol. He reports that he does not use drugs.   Physical Exam: There were no vitals taken for this visit.  Constitutional:  Alert and oriented, No acute distress. HEENT: [place] AT, moist mucus membranes.  Trachea midline, no masses. Cardiovascular: No clubbing, cyanosis, or edema. Respiratory: Normal respiratory effort, no increased work of breathing. Skin: No rashes, bruises or suspicious lesions. Neurologic: Grossly intact, no focal deficits, moving all 4 extremities. Psychiatric: Normal mood and affect.  Laboratory Data:  Lab Results  Component Value Date   CREATININE 0.87 06/20/2021   Lab Results  Component Value Date   HGBA1C  6.6 (H) 06/20/2021    Urinalysis   Pertinent Imaging:    Assessment & Plan:     No follow-ups on file.  I,Kailey Littlejohn,acting as a Education administrator for Hollice Espy, MD.,have documented all relevant documentation on the behalf of Hollice Espy, MD,as directed by  Hollice Espy, MD while in the presence of Hollice Espy, [place] [address], [place],  [address] [phone]

## 2022-03-11 ENCOUNTER — Encounter: Payer: Self-pay | Admitting: Urology

## 2022-03-11 ENCOUNTER — Ambulatory Visit: Payer: Medicare Other | Admitting: Urology

## 2022-03-11 VITALS — BP 138/88 | HR 78 | Ht 69.0 in | Wt 134.0 lb

## 2022-03-11 DIAGNOSIS — N2889 Other specified disorders of kidney and ureter: Secondary | ICD-10-CM | POA: Diagnosis not present

## 2022-05-15 ENCOUNTER — Other Ambulatory Visit: Payer: Self-pay | Admitting: Family Medicine

## 2022-05-15 DIAGNOSIS — E1169 Type 2 diabetes mellitus with other specified complication: Secondary | ICD-10-CM

## 2022-05-15 NOTE — Telephone Encounter (Signed)
Requested medications are due for refill today.  yes  Requested medications are on the active medications list.  yes  Last refill. 06/10/2021 #360 3 rf  Future visit scheduled.   no  Notes to clinic.  PT is more than 3 months overdue for OV    Requested Prescriptions  Pending Prescriptions Disp Refills   metFORMIN (GLUCOPHAGE-XR) 500 MG 24 hr tablet [Pharmacy Med Name: METFORMIN ER 500MG 24HR TABS] 360 tablet 0    Sig: TAKE TWO TABLETS BY MOUTH TWICE DAILY     Endocrinology:  Diabetes - Biguanides Failed - 05/15/2022  3:42 AM      Failed - HBA1C is between 0 and 7.9 and within 180 days    Hemoglobin A1C  Date Value Ref Range Status  09/29/2019 6.9  Final    Comment:    [facility]   Hgb A1c MFr Bld  Date Value Ref Range Status  06/20/2021 6.6 (H) <5.7 % of total Hgb Final    Comment:    For someone without known diabetes, a hemoglobin A1c value of 6.5% or greater indicates that they may have  diabetes and this should be confirmed with a follow-up  test. . For someone with known diabetes, a value <7% indicates  that their diabetes is well controlled and a value  greater than or equal to 7% indicates suboptimal  control. A1c targets should be individualized based on  duration of diabetes, age, comorbid conditions, and  other considerations. . Currently, no consensus exists regarding use of hemoglobin A1c for diagnosis of diabetes for children. .          Failed - B12 Level in normal range and within 720 days    No results found for: "VITAMINB12"       Failed - Valid encounter within last 6 months    Recent Outpatient Visits           8 months ago Uncircumcised male   [facility] [place], Devonne Doughty, DO   11 months ago Annual physical exam   [facility] Olin Hauser, DO   1 year ago Needs flu shot   [place], Devonne Doughty, DO   1 year ago COPD with acute exacerbation  [facility])   [facility] Olin Hauser, DO   1 year ago Type 2 diabetes mellitus with other specified complication, without long-term current use of insulin ([place])   [facility] Olin Hauser, DO       Future Appointments             In 3 months Hollice Espy, MD [place] in normal range and within 360 days    Creat  Date Value Ref Range Status  06/20/2021 0.87 0.70 - 1.22 mg/dL Final         Passed - eGFR in normal range and within 360 days    GFR, Est African American  Date Value Ref Range Status  04/05/2020 93 > OR = 60 mL/min/1.50m Final   GFR, Est Non African American  Date Value Ref Range Status  04/05/2020 80 > OR = 60 mL/min/1.761mFinal   eGFR  Date Value Ref Range Status  06/20/2021 86 > OR = 60 mL/min/1.7336minal    Comment:    The eGFR is based on the CKD-EPI 2021 equation. To calculate  the  new eGFR from a previous Creatinine or Cystatin C result, go to https://www.kidney.org/professionals/ kdoqi/gfr%5Fcalculator          Passed - CBC within normal limits and completed in the last 12 months    WBC  Date Value Ref Range Status  06/20/2021 6.1 3.8 - 10.8 Thousand/uL Final   RBC  Date Value Ref Range Status  06/20/2021 4.54 4.20 - 5.80 Million/uL Final   Hemoglobin  Date Value Ref Range Status  06/20/2021 13.3 13.2 - 17.1 g/dL Final   HCT  Date Value Ref Range Status  06/20/2021 41.2 38.5 - 50.0 % Final   MCHC  Date Value Ref Range Status  06/20/2021 32.3 32.0 - 36.0 g/dL Final   [facility]  Date Value Ref Range Status  06/20/2021 29.3 27.0 - 33.0 pg Final   MCV  Date Value Ref Range Status  06/20/2021 90.7 80.0 - 100.0 fL Final   No results found for: "PLTCOUNTKUC", "LABPLAT", "POCPLA" RDW  Date Value Ref Range Status  06/20/2021 12.9 11.0 - 15.0 % Final

## 2022-05-20 ENCOUNTER — Ambulatory Visit (INDEPENDENT_AMBULATORY_CARE_PROVIDER_SITE_OTHER): Payer: Medicare Other

## 2022-05-20 DIAGNOSIS — Z23 Encounter for immunization: Secondary | ICD-10-CM | POA: Diagnosis not present

## 2022-06-24 ENCOUNTER — Other Ambulatory Visit: Payer: Self-pay | Admitting: Family Medicine

## 2022-06-24 DIAGNOSIS — E1169 Type 2 diabetes mellitus with other specified complication: Secondary | ICD-10-CM

## 2022-06-24 DIAGNOSIS — K219 Gastro-esophageal reflux disease without esophagitis: Secondary | ICD-10-CM

## 2022-06-24 DIAGNOSIS — I10 Essential (primary) hypertension: Secondary | ICD-10-CM

## 2022-06-25 NOTE — Telephone Encounter (Signed)
Requested Prescriptions  Pending Prescriptions Disp Refills   amLODipine (NORVASC) 10 MG tablet [Pharmacy Med Name: AMLODIPINE BESYLATE 10MG TABLETS] 90 tablet 3    Sig: TAKE ONE TABLET BY MOUTH ONCE DAILY     Cardiovascular: Calcium Channel Blockers 2 Failed - 06/24/2022  5:58 PM      Failed - Valid encounter within last 6 months    Recent Outpatient Visits           9 months ago Uncircumcised male   [place], DO   1 year ago Annual physical exam   [facility] Olin Hauser, DO   1 year ago Needs flu shot   [place], Devonne Doughty, DO   1 year ago COPD with acute exacerbation [facility])   [facility] Olin Hauser, DO   1 year ago Type 2 diabetes mellitus with other specified complication, without long-term current use of insulin ([place])   [place], Devonne Doughty, DO       Future Appointments             In 3 weeks [place], Devonne Doughty, DO [facility], [place]   In 2 months Hollice Espy, MD [facility] [place]            Passed - Last BP in normal range    BP Readings from Last 1 Encounters:  03/11/22 138/88         Passed - Last Heart Rate in normal range    Pulse Readings from Last 1 Encounters:  03/11/22 78          simvastatin (ZOCOR) 20 MG tablet [Pharmacy Med Name: SIMVASTATIN 20MG TABLETS] 90 tablet 3    Sig: TAKE ONE TABLET BY MOUTH AT BEDTIME     Cardiovascular:  Antilipid - Statins Failed - 06/24/2022  5:58 PM      Failed - Lipid Panel in normal range within the last 12 months    Cholesterol  Date Value Ref Range Status  06/20/2021 128 <200 mg/dL Final   LDL Cholesterol (Calc)  Date Value Ref Range Status  06/20/2021 60 mg/dL (calc) Final    Comment:    Reference range: <100 . Desirable range <100 mg/dL for primary prevention;   <70 mg/dL for patients with CHD or  diabetic patients  with > or = 2 CHD risk factors. Marland Kitchen LDL-C is now calculated using the Martin-Hopkins  calculation, which is a validated novel method providing  better accuracy than the Friedewald equation in the  estimation of LDL-C.  Cresenciano Genre et al. Annamaria Helling. 6384;536(46): 2061-2068  (http://education.[employer].com/faq/FAQ164)    HDL  Date Value Ref Range Status  06/20/2021 47 > OR = 40 mg/dL Final   Triglycerides  Date Value Ref Range Status  06/20/2021 120 <150 mg/dL Final         Passed - Patient is not pregnant      Passed - Valid encounter within last 12 months    Recent Outpatient Visits           9 months ago Uncircumcised male   [place], Devonne Doughty, DO   1 year ago Annual physical exam   [facility] Olin Hauser, DO   1 year ago Needs flu shot   [place], DO   1 year ago COPD with acute exacerbation ([place])  [facility] [place], Devonne Doughty, DO   1 year ago Type 2 diabetes mellitus with other specified complication, without long-term current use of insulin ([place])   [facility] [place], Devonne Doughty, DO       Future Appointments             In 3 weeks [place], Devonne Doughty, DO [facility] [place], [place]   In 2 months Hollice Espy, MD [facility] [place]            Signed Prescriptions Disp Refills   omeprazole (PRILOSEC) 20 MG capsule 90 capsule 0    Sig: TAKE 1 CAPSULE BY MOUTH ONCE DAILY BEFORE BREAKFAST     Gastroenterology: Proton Pump Inhibitors Passed - 06/24/2022  5:58 PM      Passed - Valid encounter within last 12 months    Recent Outpatient Visits           9 months ago Uncircumcised male   [place], Devonne Doughty, DO   1 year ago Annual physical exam   [facility] Olin Hauser, DO   1 year ago Needs flu shot    [place], Devonne Doughty, DO   1 year ago COPD with acute exacerbation [facility])   [facility], DO   1 year ago Type 2 diabetes mellitus with other specified complication, without long-term current use of insulin ([place])   [facility], Devonne Doughty, DO       Future Appointments             In 3 weeks [place], Devonne Doughty, DO [facility], [place]   In 2 months Hollice Espy, MD [facility] [place]            Refused Prescriptions Disp Refills   metFORMIN (GLUCOPHAGE-XR) 500 MG 24 hr tablet [Pharmacy Med Name: METFORMIN ER 500MG 24HR TABS] 360 tablet 0    Sig: TAKE 2 TABLETS BY MOUTH TWICE DAILY     Endocrinology:  Diabetes - Biguanides Failed - 06/24/2022  5:58 PM      Failed - Cr in normal range and within 360 days    Creat  Date Value Ref Range Status  06/20/2021 0.87 0.70 - 1.22 mg/dL Final         Failed - HBA1C is between 0 and 7.9 and within 180 days    Hemoglobin A1C  Date Value Ref Range Status  09/29/2019 6.9  Final    Comment:    [facility]   Hgb A1c MFr Bld  Date Value Ref Range Status  06/20/2021 6.6 (H) <5.7 % of total Hgb Final    Comment:    For someone without known diabetes, a hemoglobin A1c value of 6.5% or greater indicates that they may have  diabetes and this should be confirmed with a follow-up  test. . For someone with known diabetes, a value <7% indicates  that their diabetes is well controlled and a value  greater than or equal to 7% indicates suboptimal  control. A1c targets should be individualized based on  duration of diabetes, age, comorbid conditions, and  other considerations. . Currently, no consensus exists regarding use of hemoglobin A1c for diagnosis of diabetes for children. .          Failed - eGFR in normal range and within 360 days    GFR, Est African American  Date Value Ref Range Status   04/05/2020 93 > OR = 60 mL/min/1.48m Final   GFR, Est Non African American  Date Value Ref Range Status  04/05/2020 80 > OR = 60 mL/min/1.784mFinal   eGFR  Date Value Ref Range Status  06/20/2021 86 > OR = 60 mL/min/1.7349minal    Comment:    The eGFR is based on the CKD-EPI 2021 equation. To calculate  the new eGFR from a previous Creatinine or Cystatin C result, go to https://www.kidney.org/professionals/ kdoqi/gfr%5Fcalculator          Failed - B12 Level in normal range and within 720 days    No results found for: "VITAMINB12"       Failed - Valid encounter within last 6 months    Recent Outpatient Visits           9 months ago Uncircumcised male   [place]   1 year ago Annual physical exam   [facility]   1 year ago Needs flu shot   [place]   1 year ago [facility] with acute exacerbation [facility]   1 year ago Type 2 diabetes mellitus with other specified complication, without long-term current use of insulin ([place] [facility]oughtyO       Future Appointments             In 3 weeks [facility] [facility]onne DoughtyO [facility] 2 months BraHollice EspyD [facility] Calcium            Failed - CBC within normal limits and completed in the last 12 months    WBC  Date Value Ref Range Status  06/20/2021 6.1 3.8 - 10.8 Thousand/uL Final   RBC  Date Value Ref Range Status  06/20/2021 4.54 4.20 - 5.80 Million/uL Final   Hemoglobin  Date Value Ref Range Status  06/20/2021 13.3 13.2 - 17.1 g/dL Final   HCT  Date Value Ref Range Status  06/20/2021 41.2 38.5 - 50.0 % Final   MCHC  Date Value Ref Range Status  06/20/2021 32.3 32.0 - 36.0 g/dL Final   [facility] Hospital[facility] Value Ref Range Status   06/20/2021 29.3 27.0 - 33.0 pg Final   MCV  Date Value Ref Range Status  06/20/2021 90.7 80.0 - 100.0 fL Final   No results found for: "PLTCOUNTKUC", "LABPLAT", "POCPLA" RDW  Date Value Ref Range Status  06/20/2021 12.9 11.0 - 15.0 % Final

## 2022-06-25 NOTE — Telephone Encounter (Signed)
Requested medication (s) are due for refill today: yes  Requested medication (s) are on the active medication list: yes  Last refill:  Norvasc 06/18/21 #90 and 3 RF, Zocor 06/18/21 #90 and 3 RF  Future visit scheduled: yes 07/16/22, seen 09/03/21  Notes to clinic:  Norvasc failed due to no visit within 6 months, Zocor's labs are out of date, 06/20/21, both rx have expired for filling, has upcoming appt 07/16/22, please assess.      Requested Prescriptions  Pending Prescriptions Disp Refills   amLODipine (NORVASC) 10 MG tablet [Pharmacy Med Name: AMLODIPINE BESYLATE 10MG TABLETS] 90 tablet 3    Sig: TAKE ONE TABLET BY MOUTH ONCE DAILY     Cardiovascular: Calcium Channel Blockers 2 Failed - 06/24/2022  5:58 PM      Failed - Valid encounter within last 6 months    Recent Outpatient Visits           9 months ago Uncircumcised male   [place], DO   1 year ago Annual physical exam   [facility] Olin Hauser, DO   1 year ago Needs flu shot   [place], Devonne Doughty, DO   1 year ago COPD with acute exacerbation [facility])   [facility] Olin Hauser, DO   1 year ago Type 2 diabetes mellitus with other specified complication, without long-term current use of insulin ([place])   [place], Devonne Doughty, DO       Future Appointments             In 3 weeks [place], Devonne Doughty, DO [facility], [place]   In 2 months Hollice Espy, MD [facility] [place]            Passed - Last BP in normal range    BP Readings from Last 1 Encounters:  03/11/22 138/88         Passed - Last Heart Rate in normal range    Pulse Readings from Last 1 Encounters:  03/11/22 78          simvastatin (ZOCOR) 20 MG tablet [Pharmacy Med Name: SIMVASTATIN 20MG TABLETS] 90 tablet 3    Sig: TAKE ONE TABLET BY MOUTH AT BEDTIME      Cardiovascular:  Antilipid - Statins Failed - 06/24/2022  5:58 PM      Failed - Lipid Panel in normal range within the last 12 months    Cholesterol  Date Value Ref Range Status  06/20/2021 128 <200 mg/dL Final   LDL Cholesterol (Calc)  Date Value Ref Range Status  06/20/2021 60 mg/dL (calc) Final    Comment:    Reference range: <100 . Desirable range <100 mg/dL for primary prevention;   <70 mg/dL for patients with CHD or diabetic patients  with > or = 2 CHD risk factors. Marland Kitchen LDL-C is now calculated using the Martin-Hopkins  calculation, which is a validated novel method providing  better accuracy than the Friedewald equation in the  estimation of LDL-C.  Cresenciano Genre et al. Annamaria Helling. 4818;563(14): 2061-2068  (http://education.[facility].com/faq/FAQ164)    HDL  Date Value Ref Range Status  06/20/2021 47 > OR = 40 mg/dL Final   Triglycerides  Date Value Ref Range Status  06/20/2021 120 <150 mg/dL Final         Passed - Patient is not pregnant      Passed - Valid encounter within  last 12 months    Recent Outpatient Visits           9 months ago Uncircumcised male   [place], Devonne Doughty, DO   1 year ago Annual physical exam   [place], Devonne Doughty, DO   1 year ago Needs flu shot   [place], Devonne Doughty, DO   1 year ago COPD with acute exacerbation [facility])   [facility] Olin Hauser, DO   1 year ago Type 2 diabetes mellitus with other specified complication, without long-term current use of insulin ([place])   [facility], Devonne Doughty, DO       Future Appointments             In 3 weeks [place], Devonne Doughty, DO [facility], [place]   In 2 months Hollice Espy, MD [facility]rology [place]            Signed Prescriptions Disp Refills   omeprazole (PRILOSEC) 20 MG capsule 90 capsule 0    Sig:  TAKE 1 CAPSULE BY MOUTH ONCE DAILY BEFORE BREAKFAST     Gastroenterology: Proton Pump Inhibitors Passed - 06/24/2022  5:58 PM      Passed - Valid encounter within last 12 months    Recent Outpatient Visits           9 months ago Uncircumcised male   [place], Devonne Doughty, DO   1 year ago Annual physical exam   [facility] Olin Hauser, DO   1 year ago Needs flu shot   [place], Devonne Doughty, DO   1 year ago COPD with acute exacerbation [facility])   [facility] Olin Hauser, DO   1 year ago Type 2 diabetes mellitus with other specified complication, without long-term current use of insulin ([place])   [facility], Devonne Doughty, DO       Future Appointments             In 3 weeks [place], Devonne Doughty, DO [facility], [place]   In 2 months Hollice Espy, MD [facility]rology [place]            Refused Prescriptions Disp Refills   metFORMIN (GLUCOPHAGE-XR) 500 MG 24 hr tablet [Pharmacy Med Name: METFORMIN ER 500MG 24HR TABS] 360 tablet 0    Sig: TAKE 2 TABLETS BY MOUTH TWICE DAILY     Endocrinology:  Diabetes - Biguanides Failed - 06/24/2022  5:58 PM      Failed - Cr in normal range and within 360 days    Creat  Date Value Ref Range Status  06/20/2021 0.87 0.70 - 1.22 mg/dL Final         Failed - HBA1C is between 0 and 7.9 and within 180 days    Hemoglobin A1C  Date Value Ref Range Status  09/29/2019 6.9  Final    Comment:    [facility]   Hgb A1c MFr Bld  Date Value Ref Range Status  06/20/2021 6.6 (H) <5.7 % of total Hgb Final    Comment:    For someone without known diabetes, a hemoglobin A1c value of 6.5% or greater indicates that they may have  diabetes and this should be confirmed with a follow-up  test. . For someone with known diabetes, a value <7% indicates  that their diabetes is well  controlled and a value  greater than or equal to 7% indicates suboptimal  control. A1c targets should be individualized based on  duration of diabetes, age, comorbid conditions, and  other considerations. . Currently, no consensus exists regarding use of hemoglobin A1c for diagnosis of diabetes for children. .          Failed - eGFR in normal range and within 360 days    GFR, Est African American  Date Value Ref Range Status  04/05/2020 93 > OR = 60 mL/min/1.38m Final   GFR, Est Non African American  Date Value Ref Range Status  04/05/2020 80 > OR = 60 mL/min/1.765mFinal   eGFR  Date Value Ref Range Status  06/20/2021 86 > OR = 60 mL/min/1.7331minal    Comment:    The eGFR is based on the CKD-EPI 2021 equation. To calculate  the new eGFR from a previous Creatinine or Cystatin C result, go to https://www.kidney.org/professionals/ kdoqi/gfr%[email or web address]          Failed - B12 Level in normal range and within 720 days    No results found for: "VITAMINB12"       Failed - Valid encounter within last 6 months    Recent Outpatient Visits           9 months ago Uncircumcised male   [place]   1 year ago Annual physical exam   [facility]   1 year ago Needs flu shot   [place]   1 year ago [facility] with acute exacerbation [facility]   1 year ago Type 2 diabetes mellitus with other specified complication, without long-term current use of insulin ([place]CC[place]Dev[place] DoughtyO       Future Appointments             In 3 weeks KarPar[place] RangerleDevonne DoughtyO [facility] London[facility]In 2 months BraHollice EspyD ConIntegr[facility] Center[facility] [place]            Failed - CBC within normal limits and completed in the last 12 months    WBC  Date  Value Ref Range Status  06/20/2021 6.1 3.8 - 10.8 Thousand/uL Final   RBC  Date Value Ref Range Status  06/20/2021 4.54 4.20 - 5.80 Million/uL Final   Hemoglobin  Date Value Ref Range Status  06/20/2021 13.3 13.2 - 17.1 g/dL Final   HCT  Date Value Ref Range Status  06/20/2021 41.2 38.5 - 50.0 % Final   MCHC  Date Value Ref Range Status  06/20/2021 32.3 32.0 - 36.0 g/dL Final   MCHSo [facility] Value Ref Range Status  06/20/2021 29.3 27.0 - 33.0 pg Final   MCV  Date Value Ref Range Status  06/20/2021 90.7 80.0 - 100.0 fL Final   No results found for: "PLTCOUNTKUC", "LABPLAT", "POCPLA" RDW  Date Value Ref Range Status  06/20/2021 12.9 11.0 - 15.0 % Final

## 2022-06-25 NOTE — Telephone Encounter (Signed)
Requested Prescriptions  Pending Prescriptions Disp Refills   metFORMIN (GLUCOPHAGE-XR) 500 MG 24 hr tablet [Pharmacy Med Name: METFORMIN ER 500MG 24HR TABS] 360 tablet 0    Sig: TAKE 2 TABLETS BY MOUTH TWICE DAILY     Endocrinology:  Diabetes - Biguanides Failed - 06/24/2022  5:58 PM      Failed - Cr in normal range and within 360 days    Creat  Date Value Ref Range Status  06/20/2021 0.87 0.70 - 1.22 mg/dL Final         Failed - HBA1C is between 0 and 7.9 and within 180 days    Hemoglobin A1C  Date Value Ref Range Status  09/29/2019 6.9  Final    Comment:    [facility]   Hgb A1c MFr Bld  Date Value Ref Range Status  06/20/2021 6.6 (H) <5.7 % of total Hgb Final    Comment:    For someone without known diabetes, a hemoglobin A1c value of 6.5% or greater indicates that they may have  diabetes and this should be confirmed with a follow-up  test. . For someone with known diabetes, a value <7% indicates  that their diabetes is well controlled and a value  greater than or equal to 7% indicates suboptimal  control. A1c targets should be individualized based on  duration of diabetes, age, comorbid conditions, and  other considerations. . Currently, no consensus exists regarding use of hemoglobin A1c for diagnosis of diabetes for children. .          Failed - eGFR in normal range and within 360 days    GFR, Est African American  Date Value Ref Range Status  04/05/2020 93 > OR = 60 mL/min/1.73m Final   GFR, Est Non African American  Date Value Ref Range Status  04/05/2020 80 > OR = 60 mL/min/1.746mFinal   eGFR  Date Value Ref Range Status  06/20/2021 86 > OR = 60 mL/min/1.7334minal    Comment:    The eGFR is based on the CKD-EPI 2021 equation. To calculate  the new eGFR from a previous Creatinine or Cystatin C result, go to https://www.kidney.org/professionals/ kdoqi/gfr%5Fcalculator          Failed - B12 Level in normal range and within 720 days     No results found for: "VITAMINB12"       Failed - Valid encounter within last 6 months    Recent Outpatient Visits           9 months ago Uncircumcised male   [place]   1 year ago Annual physical exam   [facility]   1 year ago Needs flu shot   [place]onne DoughtyO   1 year ago COPD with acute exacerbation ([facility] Sou[place]O   1 year ago Type 2 diabetes mellitus with other specified complication, without long-term current use of insulin ([facility] SouNortonleDevonne DoughtyO       Future Appointments             In 3 weeks KarParks RangereDevonne DoughtyO SouStillwater Medical PerryECLiberty CityIn 2 months BraHollice EspyD ConPres[facility] [place]            Failed - CBC within normal limits and completed in the last 12 months  WBC  Date Value Ref Range Status  06/20/2021 6.1 3.8 - 10.8 Thousand/uL Final   RBC  Date Value Ref Range Status  06/20/2021 4.54 4.20 - 5.80 Million/uL Final   Hemoglobin  Date Value Ref Range Status  06/20/2021 13.3 13.2 - 17.1 g/dL Final   HCT  Date Value Ref Range Status  06/20/2021 41.2 38.5 - 50.0 % Final   MCHC  Date Value Ref Range Status  06/20/2021 32.3 32.0 - 36.0 g/dL Final   [facility]  Date Value Ref Range Status  06/20/2021 29.3 27.0 - 33.0 pg Final   MCV  Date Value Ref Range Status  06/20/2021 90.7 80.0 - 100.0 fL Final   No results found for: "PLTCOUNTKUC", "LABPLAT", "POCPLA" RDW  Date Value Ref Range Status  06/20/2021 12.9 11.0 - 15.0 % Final          amLODipine (NORVASC) 10 MG tablet [Pharmacy Med Name: AMLODIPINE BESYLATE 10MG TABLETS] 90 tablet 3    Sig: TAKE ONE TABLET BY MOUTH ONCE DAILY     Cardiovascular: Calcium Channel Blockers 2 Failed - 06/24/2022  5:58 PM      Failed - Valid encounter within last  6 months    Recent Outpatient Visits           9 months ago Uncircumcised male   [place], DO   1 year ago Annual physical exam   [facility] O[facility] Hauser, DO   1 year ago Needs flu shot   [facility] Olin Hauser, DO   1 year ago COPD with acute exacerbation [facility])   [facility] Olin Hauser, DO   1 year ago Type 2 diabetes mellitus with other specified complication, without long-term current use of insulin ([place])   [facility] [place], Devonne Doughty, DO       Future Appointments             In 3 weeks [place], Devonne Doughty, DO [facility], [place]   In 2 months Hollice Espy, MD [facility]rology [place]            Passed - Last BP in normal range    BP Readings from Last 1 Encounters:  03/11/22 138/88         Passed - Last Heart Rate in normal range    Pulse Readings from Last 1 Encounters:  03/11/22 78          simvastatin (ZOCOR) 20 MG tablet [Pharmacy Med Name: SIMVASTATIN 20MG TABLETS] 90 tablet 3    Sig: TAKE ONE TABLET BY MOUTH AT BEDTIME     Cardiovascular:  Antilipid - Statins Failed - 06/24/2022  5:58 PM      Failed - Lipid Panel in normal range within the last 12 months    Cholesterol  Date Value Ref Range Status  06/20/2021 128 <200 mg/dL Final   LDL Cholesterol (Calc)  Date Value Ref Range Status  06/20/2021 60 mg/dL (calc) Final    Comment:    Reference range: <100 . Desirable range <100 mg/dL for primary prevention;   <70 mg/dL for patients with CHD or diabetic patients  with > or = 2 CHD risk factors. Marland Kitchen LDL-C is now calculated using the Martin-Hopkins  calculation, which is a validated novel method providing  better accuracy than the Friedewald equation in the  estimation of LDL-C.  Cresenciano Genre et al. Annamaria Helling. 6629;476(54): [phone]   (  http://education.[facility].com/faq/FAQ164)    HDL  Date Value Ref Range Status  06/20/2021 47 > OR = 40 mg/dL Final   Triglycerides  Date Value Ref Range Status  06/20/2021 120 <150 mg/dL Final         Passed - Patient is not pregnant      Passed - Valid encounter within last 12 months    Recent Outpatient Visits           9 months ago Uncircumcised male   [place], Devonne Doughty, DO   1 year ago Annual physical exam   [facility] Olin Hauser, DO   1 year ago Needs flu shot   [place], Devonne Doughty, DO   1 year ago COPD with acute exacerbation [facility])   [facility], Devonne Doughty, DO   1 year ago Type 2 diabetes mellitus with other specified complication, without long-term current use of insulin ([place])   [facility], Devonne Doughty, DO       Future Appointments             In 3 weeks [place], Devonne Doughty, DO [facility], [place]   In 2 months Hollice Espy, MD [facility] [place]             omeprazole ([place]) 20 MG capsule [Pharmacy Med Name: OMEPRAZOLE 20MG CAPSULES] 90 capsule 0    Sig: TAKE 1 [place]     Gastroenterology: Proton Pump Inhibitors Passed - 06/24/2022  5:58 PM      Passed - Valid encounter within last 12 months    Recent Outpatient Visits           9 months ago Uncircumcised male   [place], Devonne Doughty, DO   1 year ago Annual physical exam   [facility] Olin Hauser, DO   1 year ago Needs flu shot   [place], Devonne Doughty, DO   1 year ago COPD with acute exacerbation [facility])   [facility] Olin Hauser, DO   1 year ago Type 2 diabetes mellitus with other specified complication, without long-term current use of insulin  ([place])   [place], Devonne Doughty, DO       Future Appointments             In 3 weeks [place], Devonne Doughty, DO [facility], [place]   In 2 months Hollice Espy, [place]

## 2022-07-07 ENCOUNTER — Other Ambulatory Visit: Payer: Self-pay

## 2022-07-07 DIAGNOSIS — Z0000 Encounter for general adult medical examination without abnormal findings: Secondary | ICD-10-CM

## 2022-07-07 DIAGNOSIS — Z125 Encounter for screening for malignant neoplasm of prostate: Secondary | ICD-10-CM

## 2022-07-07 DIAGNOSIS — I10 Essential (primary) hypertension: Secondary | ICD-10-CM

## 2022-07-07 DIAGNOSIS — E1169 Type 2 diabetes mellitus with other specified complication: Secondary | ICD-10-CM

## 2022-07-08 ENCOUNTER — Other Ambulatory Visit: Payer: Medicare Other

## 2022-07-08 DIAGNOSIS — Z0000 Encounter for general adult medical examination without abnormal findings: Secondary | ICD-10-CM | POA: Diagnosis not present

## 2022-07-08 DIAGNOSIS — I10 Essential (primary) hypertension: Secondary | ICD-10-CM | POA: Diagnosis not present

## 2022-07-08 DIAGNOSIS — M545 Low back pain, unspecified: Secondary | ICD-10-CM | POA: Diagnosis not present

## 2022-07-08 DIAGNOSIS — E1169 Type 2 diabetes mellitus with other specified complication: Secondary | ICD-10-CM | POA: Diagnosis not present

## 2022-07-08 DIAGNOSIS — M4726 Other spondylosis with radiculopathy, lumbar region: Secondary | ICD-10-CM | POA: Diagnosis not present

## 2022-07-08 DIAGNOSIS — M48062 Spinal stenosis, lumbar region with neurogenic claudication: Secondary | ICD-10-CM | POA: Diagnosis not present

## 2022-07-08 DIAGNOSIS — M5432 Sciatica, left side: Secondary | ICD-10-CM | POA: Diagnosis not present

## 2022-07-08 DIAGNOSIS — E785 Hyperlipidemia, unspecified: Secondary | ICD-10-CM | POA: Diagnosis not present

## 2022-07-09 LAB — COMPREHENSIVE METABOLIC PANEL
AG Ratio: 1.8 (calc) (ref 1.0–2.5)
ALT: 9 U/L (ref 9–46)
AST: 12 U/L (ref 10–35)
Albumin: 4.4 g/dL (ref 3.6–5.1)
Alkaline phosphatase (APISO): 50 U/L (ref 35–144)
BUN: 11 mg/dL (ref 7–25)
CO2: 32 mmol/L (ref 20–32)
Calcium: 9.6 mg/dL (ref 8.6–10.3)
Chloride: 102 mmol/L (ref 98–110)
Creat: 0.84 mg/dL (ref 0.70–1.22)
Globulin: 2.5 g/dL (calc) (ref 1.9–3.7)
Glucose, Bld: 144 mg/dL — ABNORMAL HIGH (ref 65–99)
Potassium: 4.9 mmol/L (ref 3.5–5.3)
Sodium: 140 mmol/L (ref 135–146)
Total Bilirubin: 0.7 mg/dL (ref 0.2–1.2)
Total Protein: 6.9 g/dL (ref 6.1–8.1)

## 2022-07-09 LAB — CBC WITH DIFFERENTIAL/PLATELET
Absolute Monocytes: 570 cells/uL (ref 200–950)
Basophils Absolute: 40 cells/uL (ref 0–200)
Basophils Relative: 0.6 %
Eosinophils Absolute: 248 cells/uL (ref 15–500)
Eosinophils Relative: 3.7 %
HCT: 40.4 % (ref 38.5–50.0)
Hemoglobin: 13.4 g/dL (ref 13.2–17.1)
Lymphs Abs: 1796 cells/uL (ref 850–3900)
MCH: 29.1 pg (ref 27.0–33.0)
MCHC: 33.2 g/dL (ref 32.0–36.0)
MCV: 87.6 fL (ref 80.0–100.0)
MPV: 10.6 fL (ref 7.5–12.5)
Monocytes Relative: 8.5 %
Neutro Abs: 4047 cells/uL (ref 1500–7800)
Neutrophils Relative %: 60.4 %
Platelets: 305 10*3/uL (ref 140–400)
RBC: 4.61 10*6/uL (ref 4.20–5.80)
RDW: 12.9 % (ref 11.0–15.0)
Total Lymphocyte: 26.8 %
WBC: 6.7 10*3/uL (ref 3.8–10.8)

## 2022-07-09 LAB — LIPID PANEL
Cholesterol: 122 mg/dL (ref <200)
HDL: 56 mg/dL (ref >=40)
LDL Cholesterol (Calc): 52 mg/dL (calc)
Non-HDL Cholesterol (Calc): 66 mg/dL (calc) (ref <130)
Total CHOL/HDL Ratio: 2.2 (calc) (ref <5.0)
Triglycerides: 64 mg/dL (ref <150)

## 2022-07-09 LAB — HEMOGLOBIN A1C
Hgb A1c MFr Bld: 7.8 % of total Hgb — ABNORMAL HIGH (ref <5.7)
Mean Plasma Glucose: 177 mg/dL
eAG (mmol/L): 9.8 mmol/L

## 2022-07-09 LAB — PSA: PSA: 0.14 ng/mL (ref <=4.00)

## 2022-07-16 ENCOUNTER — Ambulatory Visit (INDEPENDENT_AMBULATORY_CARE_PROVIDER_SITE_OTHER): Payer: Medicare Other | Admitting: Family Medicine

## 2022-07-16 ENCOUNTER — Encounter: Payer: Self-pay | Admitting: Family Medicine

## 2022-07-16 VITALS — BP 136/68 | HR 93 | Ht 69.0 in | Wt 134.0 lb

## 2022-07-16 DIAGNOSIS — J432 Centrilobular emphysema: Secondary | ICD-10-CM

## 2022-07-16 DIAGNOSIS — E785 Hyperlipidemia, unspecified: Secondary | ICD-10-CM

## 2022-07-16 DIAGNOSIS — N400 Enlarged prostate without lower urinary tract symptoms: Secondary | ICD-10-CM

## 2022-07-16 DIAGNOSIS — I10 Essential (primary) hypertension: Secondary | ICD-10-CM | POA: Diagnosis not present

## 2022-07-16 DIAGNOSIS — Z0000 Encounter for general adult medical examination without abnormal findings: Secondary | ICD-10-CM

## 2022-07-16 DIAGNOSIS — E1169 Type 2 diabetes mellitus with other specified complication: Secondary | ICD-10-CM

## 2022-07-16 MED ORDER — RYBELSUS 3 MG PO TABS: 3.0000 mg | ORAL_TABLET | Freq: Every day | ORAL | 0 refills | Status: DC

## 2022-07-16 NOTE — Assessment & Plan Note (Signed)
Chronic Emphysema COPD Stable without flare Continue Symbicort He did not prefer Breztri  Offer albuterol PRN Tobacco cessation Follow-up if not improving  He has aged out of LDCT screening age >69 Future X-ray if interested

## 2022-07-16 NOTE — Assessment & Plan Note (Signed)
Well-controlled HTN - Home BP readings  No known complications    Plan:  1. Continue current BP regimen Quinapril '40mg'$  daily, Amlodipine '10mg'$  daily 2. Encourage improved lifestyle - low sodium diet, regular exercise 3. Continue monitor BP outside office, bring readings to next visit, if persistently >140/90 or new symptoms notify office sooner

## 2022-07-16 NOTE — Progress Notes (Signed)
Subjective:    Patient ID: Stephen Savage, male    DOB: 1940/08/27, 81 y.o.   MRN: [id number]  Stephen Savage is a 81 y.o. male presenting on 07/16/2022 for Annual Exam   HPI  Here for Annual Physical and lab Review  Seen Spine specialist last week He was placed on Prednisone course and also on Gabapentin.  Centrilobular Emphysema Tobacco Abuse Chronic problem, active smoker 1ppd >60 years. Not ready to quit. He has some occasional breathing problem dyspnea or cough. On Breztri   CHRONIC HTN: Reports no concerns, has controlled BP Current Meds - Quinapril '40mg'$  daily, Amlodipine '10mg'$  daily,    Reports good compliance, took meds today. Tolerating well, w/o complaints. Denies CP, dyspnea, HA, edema, dizziness / lightheadedness   CHRONIC DM, Type 2: A1c 7.8, previously 6-7 range CBGs: Avg 120-150 in AM and 200 after dinner PM. Checks CBGs twice daily Meds: Metformin XR '500mg'$  x 2 = '1000mg'$  BID Reports good compliance. Tolerating well w/o side-effects Currently on ACEi Controlled on Statin Lifestyle: - Diet (Improved)  - Exercise (Active) DM Eye Exam [facility] 04/17/20 Denies hypoglycemia, polyuria, visual changes, numbness or tingling.   HYPERLIPIDEMIA: - Reports no concerns. Last lipid panel 06/2022, controlled  - Currently taking Simvastatin '20mg'$ , tolerating well without side effects or myalgias   Bilateral Knee Pain chronic, Osteoarthritis Back Pain, lumbar disc Followed by [facility] Taking Tylenol  Following spot on Kidney, next MRI January 2024   Health Maintenance: Prostate CA Screening: Prior PSA / DRE reported normal. Last PSA 0.14 (06/2022) previously 0.24 (06/2021) Currently BPH. No known family history of prostate CA      07/16/2022    9:35 AM 06/18/2021    1:59 PM 04/20/2020    8:50 AM  Depression screen PHQ 2/9  Decreased Interest 0 0 0  Down, Depressed, Hopeless 0 0 0  PHQ - 2 Score 0 0 0    Past Medical History:  Diagnosis  Date   COPD (chronic obstructive pulmonary disease) (HCC)    Hyperlipidemia    Hypertension    Knee pain    History reviewed. No pertinent surgical history. Social History   Socioeconomic History   Marital status: Married    Spouse name: Not on file   Number of children: Not on file   Years of education: Not on file   Highest education level: Not on file  Occupational History   Occupation: retired  Tobacco Use   Smoking status: Every Day    Packs/day: 1.00    Years: 60.00    Total pack years: 60.00    Types: Cigarettes   Smokeless tobacco: Current  Vaping Use   Vaping Use: Former  Substance and Sexual Activity   Alcohol use: Not Currently   Drug use: Never   Sexual activity: Not on file  Other Topics Concern   Not on file  Social History Narrative   Not on file   Social Determinants of Health   Financial Resource Strain: Low Risk  (04/10/2020)   Overall Financial Resource Strain (CARDIA)    Difficulty of Paying Living Expenses: Not hard at all  Food Insecurity: No Food Insecurity (04/10/2020)   Hunger Vital Sign    Worried About Running Out of Food in the Last Year: Never true    [place] in the Last Year: Never true  Transportation Needs: No Transportation Needs (04/10/2020)   PRAPARE - Hydrologist (Medical): No  Lack of Transportation (Non-Medical): No  Physical Activity: Inactive (04/10/2020)   Exercise Vital Sign    Days of Exercise per Week: 0 days    Minutes of Exercise per Session: 0 min  Stress: No Stress Concern Present (04/10/2020)   [place]    Feeling of Stress : Not at all  Social Connections: Not on file  Intimate Partner Violence: Not on file   Family History  Problem Relation Age of Onset   Heart disease Mother 29   Heart attack Mother    Diabetes Father 36   Current Outpatient Medications on File Prior to Visit  Medication Sig    amLODipine (NORVASC) 10 MG tablet TAKE ONE TABLET BY MOUTH ONCE DAILY   aspirin EC 81 MG tablet Take 81 mg by mouth daily. Swallow whole.   baclofen (LIORESAL) 10 MG tablet Take 0.5-1 tablets (5-10 mg total) by mouth 3 (three) times daily as needed for muscle spasms.   glucose blood (ONETOUCH ULTRA) test strip Use to check blood sugar twice daily   lisinopril (ZESTRIL) 40 MG tablet Take 1 tablet (40 mg total) by mouth daily.   metFORMIN (GLUCOPHAGE-XR) 500 MG 24 hr tablet TAKE TWO TABLETS BY MOUTH TWICE DAILY   nystatin-triamcinolone ointment (MYCOLOG) Apply 1 application topically 2 (two) times daily.   omeprazole (PRILOSEC) 20 MG capsule TAKE 1 CAPSULE BY MOUTH ONCE DAILY BEFORE BREAKFAST   simvastatin (ZOCOR) 20 MG tablet TAKE ONE TABLET BY MOUTH AT BEDTIME   SYMBICORT 80-4.5 MCG/ACT inhaler Inhale 2 puffs into the lungs 2 (two) times daily.   No current facility-administered medications on file prior to visit.    Review of Systems  Constitutional:  Negative for activity change, appetite change, chills, diaphoresis, fatigue and fever.  HENT:  Negative for congestion and hearing loss.   Eyes:  Negative for visual disturbance.  Respiratory:  Negative for cough, chest tightness, shortness of breath and wheezing.   Cardiovascular:  Negative for chest pain, palpitations and leg swelling.  Gastrointestinal:  Negative for abdominal pain, constipation, diarrhea, nausea and vomiting.  Genitourinary:  Negative for dysuria, frequency and hematuria.  Musculoskeletal:  Negative for arthralgias and neck pain.  Skin:  Negative for rash.  Neurological:  Negative for dizziness, weakness, light-headedness, numbness and headaches.  Hematological:  Negative for adenopathy.  Psychiatric/Behavioral:  Negative for behavioral problems, dysphoric mood and sleep disturbance.    Per HPI unless specifically indicated above     Objective:    BP 136/68 (BP Location: Left Arm, Cuff Size: Normal)   Pulse 93    Ht '5\' 9"'$  (1.753 m)   Wt 134 lb (60.8 kg)   SpO2 98%   BMI 19.79 kg/m   Wt Readings from Last 3 Encounters:  07/16/22 134 lb (60.8 kg)  03/11/22 134 lb (60.8 kg)  10/02/21 134 lb (60.8 kg)    Physical Exam Vitals and nursing note reviewed.  Constitutional:      General: He is not in acute distress.    Appearance: He is well-developed. He is not diaphoretic.     Comments: Well-appearing, comfortable, cooperative  HENT:     Head: Normocephalic and atraumatic.  Eyes:     General:        Right eye: No discharge.        Left eye: No discharge.     Conjunctiva/sclera: Conjunctivae normal.  Neck:     Thyroid: No thyromegaly.  Cardiovascular:     Rate and  Rhythm: Normal rate and regular rhythm.     Pulses: Normal pulses.     Heart sounds: Normal heart sounds. No murmur heard. Pulmonary:     Effort: Pulmonary effort is normal. No respiratory distress.     Breath sounds: Normal breath sounds. No wheezing or rales.  Musculoskeletal:        General: Normal range of motion.     Cervical back: Normal range of motion and neck supple.  Lymphadenopathy:     Cervical: No cervical adenopathy.  Skin:    General: Skin is warm and dry.     Findings: No erythema or rash.  Neurological:     Mental Status: He is alert and oriented to person, place, and time. Mental status is at baseline.  Psychiatric:        Behavior: Behavior normal.     Comments: Well groomed, good eye contact, normal speech and thoughts     Diabetic Foot Exam - Simple   Simple Foot Form Diabetic Foot exam was performed with the following findings: Yes 07/16/2022  9:31 AM  Visual Inspection See comments: Yes Sensation Testing Intact to touch and monofilament testing bilaterally: Yes Pulse Check Posterior Tibialis and Dorsalis pulse intact bilaterally: Yes Comments Mild dry skin with some early callus formation heel and forefoot. No ulceration. Intact monofilament.      Results for orders placed or performed in  visit on 07/07/22  PSA  Result Value Ref Range   PSA 0.14 < OR = 4.00 ng/mL  HgB A1c  Result Value Ref Range   Hgb A1c MFr Bld 7.8 (H) <5.7 % of total Hgb   Mean Plasma Glucose 177 mg/dL   eAG (mmol/L) 9.8 mmol/L  CBC with Differential  Result Value Ref Range   WBC 6.7 3.8 - 10.8 Thousand/uL   RBC 4.61 4.20 - 5.80 Million/uL   Hemoglobin 13.4 13.2 - 17.1 g/dL   HCT 40.4 38.5 - 50.0 %   MCV 87.6 80.0 - 100.0 fL   MCH 29.1 27.0 - 33.0 pg   MCHC 33.2 32.0 - 36.0 g/dL   RDW 12.9 11.0 - 15.0 %   Platelets 305 140 - 400 Thousand/uL   MPV 10.6 7.5 - 12.5 fL   Neutro Abs 4,047 1,500 - 7,800 cells/uL   Lymphs Abs 1,796 850 - 3,900 cells/uL   Absolute Monocytes 570 200 - 950 cells/uL   Eosinophils Absolute 248 15 - 500 cells/uL   Basophils Absolute 40 0 - 200 cells/uL   Neutrophils Relative % 60.4 %   Total Lymphocyte 26.8 %   Monocytes Relative 8.5 %   Eosinophils Relative 3.7 %   Basophils Relative 0.6 %  Lipid panel  Result Value Ref Range   Cholesterol 122 <200 mg/dL   HDL 56 > OR = 40 mg/dL   Triglycerides 64 <150 mg/dL   LDL Cholesterol (Calc) 52 mg/dL (calc)   Total CHOL/HDL Ratio 2.2 <5.0 (calc)   Non-HDL Cholesterol (Calc) 66 <130 mg/dL (calc)  Comprehensive Metabolic Panel (CMET)  Result Value Ref Range   Glucose, Bld 144 (H) 65 - 99 mg/dL   BUN 11 7 - 25 mg/dL   Creat 0.84 0.70 - 1.22 mg/dL   BUN/Creatinine Ratio SEE NOTE: 6 - 22 (calc)   Sodium 140 135 - 146 mmol/L   Potassium 4.9 3.5 - 5.3 mmol/L   Chloride 102 98 - 110 mmol/L   CO2 32 20 - 32 mmol/L   Calcium 9.6 8.6 - 10.3 mg/dL  Total Protein 6.9 6.1 - 8.1 g/dL   Albumin 4.4 3.6 - 5.1 g/dL   Globulin 2.5 1.9 - 3.7 g/dL (calc)   AG Ratio 1.8 1.0 - 2.5 (calc)   Total Bilirubin 0.7 0.2 - 1.2 mg/dL   Alkaline phosphatase (APISO) 50 35 - 144 U/L   AST 12 10 - 35 U/L   ALT 9 9 - 46 U/L      Assessment & Plan:   Problem List Items Addressed This Visit     Benign non-nodular prostatic hyperplasia without  lower urinary tract symptoms   Centrilobular emphysema (HCC)    Chronic Emphysema COPD Stable without flare Continue Symbicort He did not prefer Breztri  Offer albuterol PRN Tobacco cessation Follow-up if not improving  He has aged out of LDCT screening age >1 Future X-ray if interested      Essential hypertension    Well-controlled HTN - Home BP readings  No known complications    Plan:  1. Continue current BP regimen Quinapril '40mg'$  daily, Amlodipine '10mg'$  daily 2. Encourage improved lifestyle - low sodium diet, regular exercise 3. Continue monitor BP outside office, bring readings to next visit, if persistently >140/90 or new symptoms notify office sooner      Hyperlipidemia associated with type 2 diabetes mellitus ([place])    Controlled cholesterol on statin and lifestyle Last lipid panel 06/2022  Plan: 1. Continue current meds - Simvastatin '20mg'$  daily 2. Encourage improved lifestyle - low carb/cholesterol, reduce portion size, continue improving regular exercise      Relevant Medications   RYBELSUS 3 MG TABS   Type 2 diabetes mellitus with other specified complication (HCC)    P3I 7.8 elevated, prednisone and diet Home readings reviewed overall average seems within 7-8 R5J range Complications - hyperlipidemia, coronary artery disease- increases risk of future cardiovascular complications   Plan:  1. Add Rybelsus oral '3mg'$  daily 30 day trial, notify me and I can order Rybelsus '7mg'$  daily 3 month - Continue current therapy - metformin XR '500mg'$  x 2 = '1000mg'$  BID recently ordered - May discontinue Metformin in future if need - Caution wt loss, he has some natural weight loss as smoker, he is improving this  2. Encourage improved lifestyle - low carb, low sugar diet, reduce portion size, continue improving regular exercise 3. Check CBG , bring log to next visit for review 4. Continue ASA, ACEi, Statin 5. UTD DM Foot, Ophtho recommended, Urine micro      Relevant  Medications   RYBELSUS 3 MG TABS   Other Relevant Orders   Urine Microalbumin w/creat. ratio   Other Visit Diagnoses     Annual physical exam    -  Primary       Updated Health Maintenance information Reviewed recent lab results with patient Encouraged improvement to lifestyle with diet and exercise  Goal maintain weight. Note he has had some wt loss, w/ tobacco abuse, some reduce appetite  Re ordered Urine Microalbumin   Meds ordered this encounter  Medications   RYBELSUS 3 MG TABS    Sig: Take 3 mg by mouth daily before breakfast. 30 min before other med and meal.    Dispense:  30 tablet    Refill:  0      Follow up plan: Return in about 4 months (around 11/14/2022) for 4 month DM A1c.  Nobie Putnam, DO [facility] 07/16/2022, 9:22 AM

## 2022-07-16 NOTE — Assessment & Plan Note (Signed)
A1c 7.8 elevated, prednisone and diet Home readings reviewed overall average seems within 7-8 B0Z range Complications - hyperlipidemia, coronary artery disease- increases risk of future cardiovascular complications   Plan:  1. Add Rybelsus oral '3mg'$  daily 30 day trial, notify me and I can order Rybelsus '7mg'$  daily 3 month - Continue current therapy - metformin XR '500mg'$  x 2 = '1000mg'$  BID recently ordered - May discontinue Metformin in future if need - Caution wt loss, he has some natural weight loss as smoker, he is improving this  2. Encourage improved lifestyle - low carb, low sugar diet, reduce portion size, continue improving regular exercise 3. Check CBG , bring log to next visit for review 4. Continue ASA, ACEi, Statin 5. UTD DM Foot, Ophtho recommended, Urine micro

## 2022-07-16 NOTE — Assessment & Plan Note (Addendum)
Controlled cholesterol on statin and lifestyle Last lipid panel 06/2022  Plan: 1. Continue current meds - Simvastatin '20mg'$  daily 2. Encourage improved lifestyle - low carb/cholesterol, reduce portion size, continue improving regular exercise

## 2022-07-16 NOTE — Patient Instructions (Addendum)
Thank you for coming to the office today.  Labs look good overall.  Recent Labs    07/08/22 0812  HGBA1C 7.8*    Keep taking Metformin  Starting dose is '3mg'$  once daily, first thing in morning on empty stomach, sip of water, no other medicines. Wait 30 min before first meal of day.  After 30 days we need to increase dose up to 7 mg - call us to request new rx. Please call us and leave a message for me to dose increase your Rybelsus to '7mg'$ .   Please schedule a Follow-up Appointment to: Return in about 4 months (around 11/14/2022) for 4 month DM A1c.  If you have any other questions or concerns, please feel free to call the office or send a message through [place]. You may also schedule an earlier appointment if necessary.  Additionally, you may be receiving a survey about your experience at our office within a few days to 1 week by e-mail or mail. We value your feedback.  Nobie Putnam, DO [place]

## 2022-07-17 LAB — MICROALBUMIN / CREATININE URINE RATIO
Creatinine, Urine: 46 mg/dL (ref 20–320)
Microalb Creat Ratio: 26 mcg/mg creat (ref <30)
Microalb, Ur: 1.2 mg/dL

## 2022-07-22 ENCOUNTER — Ambulatory Visit (INDEPENDENT_AMBULATORY_CARE_PROVIDER_SITE_OTHER): Payer: Medicare Other

## 2022-07-22 ENCOUNTER — Other Ambulatory Visit: Payer: Self-pay | Admitting: Family Medicine

## 2022-07-22 VITALS — BP 146/65 | HR 83 | Temp 97.8°F | Resp 20 | Ht 68.0 in | Wt 137.4 lb

## 2022-07-22 DIAGNOSIS — Z0000 Encounter for general adult medical examination without abnormal findings: Secondary | ICD-10-CM | POA: Diagnosis not present

## 2022-07-22 DIAGNOSIS — E1169 Type 2 diabetes mellitus with other specified complication: Secondary | ICD-10-CM

## 2022-07-22 MED ORDER — JANUVIA 100 MG PO TABS: 100.0000 mg | ORAL_TABLET | Freq: Every day | ORAL | 1 refills | Status: DC

## 2022-07-22 NOTE — Progress Notes (Signed)
Subjective:   Stephen Savage is a 81 y.o. male who presents for Medicare Annual/Subsequent preventive examination.  Review of Systems    Per HPI unless specifically indicated below.  Cardiac Risk Factors include: advanced age (>47mn, >>17women);male gender          Objective:    Today's Vitals   07/22/22 0939 07/22/22 0946  BP: (!) 154/76 (!) 146/65  Pulse: 91 83  Resp: 20   Temp: 97.8 F (36.6 C)   TempSrc: Oral   SpO2: 97%   Weight: 137 lb 6.4 oz (62.3 kg)   Height: '5\' 8"'$  (1.727 m)   PainSc: 0-No pain 0-No pain   Body mass index is 20.89 kg/m.     07/22/2022   11:01 AM 04/10/2020    9:05 AM 03/09/2020   10:46 AM 03/09/2020   10:26 AM  Advanced Directives  Does Patient Have a Medical Advance Directive? No No  No  Would patient like information on creating a medical advance directive? No - Patient declined  No - Patient declined     Current Medications (verified) Outpatient Encounter Medications as of 07/22/2022  Medication Sig   amLODipine (NORVASC) 10 MG tablet TAKE ONE TABLET BY MOUTH ONCE DAILY   aspirin EC 81 MG tablet Take 81 mg by mouth daily. Swallow whole.   glucose blood (ONETOUCH ULTRA) test strip Use to check blood sugar twice daily   lisinopril (ZESTRIL) 40 MG tablet Take 1 tablet (40 mg total) by mouth daily.   metFORMIN (GLUCOPHAGE-XR) 500 MG 24 hr tablet TAKE TWO TABLETS BY MOUTH TWICE DAILY   omeprazole (PRILOSEC) 20 MG capsule TAKE 1 CAPSULE BY MOUTH ONCE DAILY BEFORE BREAKFAST   simvastatin (ZOCOR) 20 MG tablet TAKE ONE TABLET BY MOUTH AT BEDTIME   SYMBICORT 80-4.5 MCG/ACT inhaler Inhale 2 puffs into the lungs 2 (two) times daily.   baclofen (LIORESAL) 10 MG tablet Take 0.5-1 tablets (5-10 mg total) by mouth 3 (three) times daily as needed for muscle spasms. (Patient not taking: Reported on 07/22/2022)   nystatin-triamcinolone ointment (MYCOLOG) Apply 1 application topically 2 (two) times daily. (Patient not taking: Reported on 07/22/2022)    [DISCONTINUED] RYBELSUS 3 MG TABS Take 3 mg by mouth daily before breakfast. 30 min before other med and meal. (Patient not taking: Reported on 07/22/2022)   No facility-administered encounter medications on file as of 07/22/2022.    Allergies (verified) Codeine   History: Past Medical History:  Diagnosis Date   COPD (chronic obstructive pulmonary disease) (HCC)    Hyperlipidemia    Hypertension    Knee pain    No past surgical history on file. Family History  Problem Relation Age of Onset   Heart disease Mother 575  Heart attack Mother    Diabetes Father 855  Social History   Socioeconomic History   Marital status: Married    Spouse name: IGeoge Lawrance  Number of children: 2   Years of education: Not on file   Highest education level: Not on file  Occupational History   Occupation: retired  Tobacco Use   Smoking status: Every Day    Packs/day: 1.00    Years: 60.00    Total pack years: 60.00    Types: Cigarettes   Smokeless tobacco: Current    Types: Chew  Vaping Use   Vaping Use: Former  Substance and Sexual Activity   Alcohol use: Not Currently   Drug use: Never   Sexual activity: Not on  file  Other Topics Concern   Not on file  Social History Narrative   Not on file   Social Determinants of Health   Financial Resource Strain: Low Risk  (04/10/2020)   Overall Financial Resource Strain (CARDIA)    Difficulty of Paying Living Expenses: Not hard at all  Food Insecurity: No Food Insecurity (07/22/2022)   Hunger Vital Sign    Worried About Running Out of Food in the Last Year: Never true    Ran Out of Food in the Last Year: Never true  Transportation Needs: No Transportation Needs (04/10/2020)   PRAPARE - Hydrologist (Medical): No    Lack of Transportation (Non-Medical): No  Physical Activity: Inactive (04/10/2020)   Exercise Vital Sign    Days of Exercise per Week: 0 days    Minutes of Exercise per Session: 0 min  Stress: No  Stress Concern Present (04/10/2020)   [place]    Feeling of Stress : Not at all  Social Connections: Not on file    Tobacco Counseling Ready to quit: No Counseling given: Yes   Clinical Intake:  Pre-visit preparation completed: No  Pain : No/denies pain Pain Score: 0-No pain     Nutritional Status: BMI of 19-24  Normal Nutritional Risks: Unintentional weight loss Diabetes: Yes CBG done?: Yes CBG resulted in Enter/ Edit results?: Yes Did pt. bring in CBG monitor from home?: No  How often do you need to have someone help you when you read instructions, pamphlets, or other written materials from your doctor or pharmacy?: 1 - Never  Diabetic?Nutrition Risk Assessment:  Has the patient had any N/V/D within the last 2 months?  No  Does the patient have any non-healing wounds?  No  Has the patient had any unintentional weight loss or weight gain?  Yes   Diabetes:  Is the patient diabetic?  Yes  If diabetic, was a CBG obtained today?  No  Did the patient bring in their glucometer from home?  No  How often do you monitor your CBG's? Twice daily .   Financial Strains and Diabetes Management:  Are you having any financial strains with the device, your supplies or your medication? No .  Does the patient want to be seen by Chronic Care Management for management of their diabetes?  No  Would the patient like to be referred to a Nutritionist or for Diabetic Management?  No   Diabetic Exams:  Diabetic Eye Exam: Overdue for diabetic eye exam. Pt has been advised about the importance in completing this exam. Patient advised to call and schedule an eye exam. Diabetic Foot Exam: Completed 07/16/2022   Interpreter Needed?: No  Information entered by :: Donnie [place], [place]   Activities of Daily Living    07/22/2022    9:49 AM  In your present state of health, do you have any difficulty performing the following  activities:  Hearing? 1  Vision? 1  Difficulty concentrating or making decisions? 0  Walking or climbing stairs? 0  Dressing or bathing? 0  Doing errands, shopping? 0    Patient Care Team: Olin Hauser, DO as PCP - General (Family Medicine)  Indicate any recent Medical Services you may have received from other than [facility] providers in the past year (date may be approximate).    No hospitalization in the past 12 months.  Assessment:   This is a routine wellness examination for Stephen Savage.  Hearing/Vision screen Admits to some hearing loss. Decline hearing aids at this time. Admits to some change in his vision. Will call and schedule an appt with Opthalmology.   Dietary issues and exercise activities discussed: Current Exercise Habits: Home exercise routine, Type of exercise: Other - see comments (yard work), Time (Minutes): 30, Frequency (Times/Week): 2, Weekly Exercise (Minutes/Week): 60, Intensity: Moderate   Goals Addressed   None    Depression Screen    07/16/2022    9:35 AM 06/18/2021    1:59 PM 04/20/2020    8:50 AM 04/10/2020    9:06 AM 02/10/2020   10:13 AM  PHQ 2/9 Scores  PHQ - 2 Score 0 0 0 0 0    Fall Risk    04/20/2020    8:50 AM 04/10/2020    9:06 AM 02/10/2020   10:13 AM  [place] in the past year? 0 0 0  Number falls in past yr: 0  0  Injury with Fall? 0  0  Risk for fall due to :  Medication side effect   Follow up Falls evaluation completed Falls evaluation completed;Education provided;Falls prevention discussed Falls evaluation completed    FALL RISK PREVENTION PERTAINING TO THE HOME:  Any stairs in or around the home? No  If so, are there any without handrails? No Home free of loose throw rugs in walkways, pet beds, electrical cords, etc? Yes  Adequate lighting in your home to reduce risk of falls? Yes   ASSISTIVE DEVICES UTILIZED TO PREVENT FALLS:  Life alert? No  Use of a cane, walker or w/c? No  Grab bars in the bathroom?  No  Shower chair or bench in shower? No  Elevated toilet seat or a handicapped toilet? No   TIMED UP AND GO:  Was the test performed? Yes .  Length of time to ambulate 10 feet: 10 sec.   Gait slow and steady without use of assistive device  Cognitive Function:        07/22/2022    9:49 AM 04/10/2020    9:08 AM  6CIT Screen  What Year? 0 points 0 points  What month? 0 points 0 points  What time? 0 points 0 points  Count back from 20 2 points 2 points  Months in reverse 0 points 2 points  Repeat phrase 2 points 4 points  Total Score 4 points 8 points    Immunizations Immunization History  Administered Date(s) Administered   Fluad Quad(high Dose 65+) 05/14/2021, 05/20/2022   Influenza, High Dose Seasonal PF 05/23/2014, 04/19/2015, 05/20/2018, 05/24/2019   Influenza-Unspecified 05/14/2017   Moderna Sars-Covid-2 Vaccination 09/30/2019, 10/28/2019   Pneumococcal Conjugate-13 08/16/2014   Pneumococcal Polysaccharide-23 07/03/2011   Tdap 06/11/2011, 12/17/2020    TDAP status: Up to date  Flu Vaccine status: Up to date  Pneumococcal vaccine status: Up to date  Covid-19 vaccine status: Information provided on how to obtain vaccines.   Qualifies for Shingles Vaccine? Yes   Zostavax completed No   Shingrix Completed?: No.    Education has been provided regarding the importance of this vaccine. Patient has been advised to call insurance company to determine out of pocket expense if they have not yet received this vaccine. Advised may also receive vaccine at local pharmacy or Health Dept. Verbalized acceptance and understanding.  Screening Tests Health Maintenance  Topic Date Due   Zoster Vaccines- Shingrix (1 of 2) Never done   OPHTHALMOLOGY EXAM  04/17/2021   COVID-19 Vaccine (3 - 2023-24  season) 04/18/2022   HEMOGLOBIN A1C  01/06/2023   Diabetic kidney evaluation - GFR measurement  07/09/2023   Diabetic kidney evaluation - Urine ACR  07/17/2023   FOOT EXAM  07/17/2023    Medicare Annual Wellness (AWV)  07/23/2023   DTaP/Tdap/Td (3 - Td or Tdap) 12/18/2030   Pneumonia Vaccine 52+ Years old  Completed   INFLUENZA VACCINE  Completed   HPV VACCINES  Aged Out    Health Maintenance  Health Maintenance Due  Topic Date Due   Zoster Vaccines- Shingrix (1 of 2) Never done   OPHTHALMOLOGY EXAM  04/17/2021   COVID-19 Vaccine (3 - 2023-24 season) 04/18/2022    Colorectal cancer screening: No longer required.   Lung Cancer Screening: (Low Dose CT Chest recommended if Age 60-80 years, 30 pack-year currently smoking OR have quit w/in 15years.) does not qualify.   Lung Cancer Screening Referral:  not applicable   Additional Screening:  Hepatitis C Screening: does not qualify; not applicable   Vision Screening: Recommended annual ophthalmology exams for early detection of glaucoma and other disorders of the eye. Is the patient up to date with their annual eye exam?  No  Who is the provider or what is the name of the office in which the patient attends annual eye exams? [facility]  If pt is not established with a provider, would they like to be referred to a provider to establish care? No .   Dental Screening: Recommended annual dental exams for proper oral hygiene  Community Resource Referral / Chronic Care Management: CRR required this visit?  No   CCM required this visit?  No      Plan:     I have personally reviewed and noted the following in the patient's chart:   Medical and social history Use of alcohol, tobacco or illicit drugs  Current medications and supplements including opioid prescriptions. Patient is not currently taking opioid prescriptions. Functional ability and status Nutritional status Physical activity Advanced directives List of other physicians Hospitalizations, surgeries, and ER visits in previous 12 months Vitals Screenings to include cognitive, depression, and falls Referrals and appointments  In addition, I  have reviewed and discussed with patient certain preventive protocols, quality metrics, and best practice recommendations. A written personalized care plan for preventive services as well as general preventive health recommendations were provided to patient.   Stephen Savage , Thank you for taking time to come for your Medicare Wellness Visit. I appreciate your ongoing commitment to your health goals. Please review the following plan we discussed and let me know if I can assist you in the future.   These are the goals we discussed:  Goals      Patient Stated     04/10/2020, no goals        This is a list of the screening recommended for you and due dates:  Health Maintenance  Topic Date Due   Zoster (Shingles) Vaccine (1 of 2) Never done   Eye exam for diabetics  04/17/2021   COVID-19 Vaccine (3 - 2023-24 season) 04/18/2022   Hemoglobin A1C  01/06/2023   Yearly kidney function blood test for diabetes  07/09/2023   Yearly kidney health urinalysis for diabetes  07/17/2023   Complete foot exam   07/17/2023   Medicare Annual Wellness Visit  07/23/2023   DTaP/Tdap/Td vaccine (3 - Td or Tdap) 12/18/2030   Pneumonia Vaccine  Completed   Flu Shot  Completed   HPV Vaccine  Aged Out  Wilson Singer, [place]   07/22/2022   Nurse Notes: Approximately 30 minute Face -To-Face Medicare Wellness Visit .

## 2022-07-22 NOTE — Patient Instructions (Signed)
Health Maintenance, Male Adopting a healthy lifestyle and getting preventive care are important in promoting health and wellness. Ask your health care provider about: The right schedule for you to have regular tests and exams. Things you can do on your own to prevent diseases and keep yourself healthy. What should I know about diet, weight, and exercise? Eat a healthy diet  Eat a diet that includes plenty of vegetables, fruits, low-fat dairy products, and lean protein. Do not eat a lot of foods that are high in solid fats, added sugars, or sodium. Maintain a healthy weight Body mass index (BMI) is a measurement that can be used to identify possible weight problems. It estimates body fat based on height and weight. Your health care provider can help determine your BMI and help you achieve or maintain a healthy weight. Get regular exercise Get regular exercise. This is one of the most important things you can do for your health. Most adults should: Exercise for at least 150 minutes each week. The exercise should increase your heart rate and make you sweat (moderate-intensity exercise). Do strengthening exercises at least twice a week. This is in addition to the moderate-intensity exercise. Spend less time sitting. Even light physical activity can be beneficial. Watch cholesterol and blood lipids Have your blood tested for lipids and cholesterol at 83 years of age, then have this test every 5 years. You may need to have your cholesterol levels checked more often if: Your lipid or cholesterol levels are high. You are older than 83 years of age. You are at high risk for heart disease. What should I know about cancer screening? Many types of cancers can be detected early and may often be prevented. Depending on your health history and family history, you may need to have cancer screening at various ages. This may include screening for: Colorectal cancer. Prostate cancer. Skin cancer. Lung  cancer. What should I know about heart disease, diabetes, and high blood pressure? Blood pressure and heart disease High blood pressure causes heart disease and increases the risk of stroke. This is more likely to develop in people who have high blood pressure readings or are overweight. Talk with your health care provider about your target blood pressure readings. Have your blood pressure checked: Every 3-5 years if you are 18-39 years of age. Every year if you are 40 years old or older. If you are between the ages of 65 and 75 and are a current or former smoker, ask your health care provider if you should have a one-time screening for abdominal aortic aneurysm (AAA). Diabetes Have regular diabetes screenings. This checks your fasting blood sugar level. Have the screening done: Once every three years after age 45 if you are at a normal weight and have a low risk for diabetes. More often and at a younger age if you are overweight or have a high risk for diabetes. What should I know about preventing infection? Hepatitis B If you have a higher risk for hepatitis B, you should be screened for this virus. Talk with your health care provider to find out if you are at risk for hepatitis B infection. Hepatitis C Blood testing is recommended for: Everyone born from 1945 through 1965. Anyone with known risk factors for hepatitis C. Sexually transmitted infections (STIs) You should be screened each year for STIs, including gonorrhea and chlamydia, if: You are sexually active and are younger than 83 years of age. You are older than 83 years of age and your   health care provider tells you that you are at risk for this type of infection. Your sexual activity has changed since you were last screened, and you are at increased risk for chlamydia or gonorrhea. Ask your health care provider if you are at risk. Ask your health care provider about whether you are at high risk for HIV. Your health care provider  may recommend a prescription medicine to help prevent HIV infection. If you choose to take medicine to prevent HIV, you should first get tested for HIV. You should then be tested every 3 months for as long as you are taking the medicine. Follow these instructions at home: Alcohol use Do not drink alcohol if your health care provider tells you not to drink. If you drink alcohol: Limit how much you have to 0-2 drinks a day. Know how much alcohol is in your drink. In the [place] one drink equals one 12 oz bottle of beer (355 mL), one 5 oz glass of wine (148 mL), or one 1 oz glass of hard liquor (44 mL). Lifestyle Do not use any products that contain nicotine or tobacco. These products include cigarettes, chewing tobacco, and vaping devices, such as e-cigarettes. If you need help quitting, ask your health care provider. Do not use street drugs. Do not share needles. Ask your health care provider for help if you need support or information about quitting drugs. General instructions Schedule regular health, dental, and eye exams. Stay current with your vaccines. Tell your health care provider if: You often feel depressed. You have ever been abused or do not feel safe at home. Summary Adopting a healthy lifestyle and getting preventive care are important in promoting health and wellness. Follow your health care provider's instructions about healthy diet, exercising, and getting tested or screened for diseases. Follow your health care provider's instructions on monitoring your cholesterol and blood pressure. This information is not intended to replace advice given to you by your health care provider. Make sure you discuss any questions you have with your health care provider. Document Revised: 12/24/2020 Document Reviewed: 12/24/2020 Elsevier Patient Education  2023 Elsevier Inc.  

## 2022-08-02 ENCOUNTER — Other Ambulatory Visit: Payer: Self-pay | Admitting: Family Medicine

## 2022-08-04 NOTE — Telephone Encounter (Signed)
Requested Prescriptions  Pending Prescriptions Disp Refills   SYMBICORT 80-4.5 MCG/ACT inhaler [Pharmacy Med Name: [place] 80/4.5MCG (120  ORAL INH)] 10.2 g 0    Sig: INHALE 2 PUFFS INTO THE LUNGS TWICE DAILY     Pulmonology:  Combination Products Passed - 08/02/2022 10:12 AM      Passed - Valid encounter within last 12 months    Recent Outpatient Visits           2 weeks ago Annual physical exam   [facility] Olin Hauser, DO   11 months ago Uncircumcised male   [place], Devonne Doughty, DO   1 year ago Annual physical exam   [facility] Olin Hauser, DO   1 year ago Needs flu shot   [place], DO   1 year ago COPD with acute exacerbation [facility])   [facility] Olin Hauser, DO       Future Appointments             In 1 month Hollice Espy, MD [place]   In 3 months [place], Devonne Doughty, [facility], [facility]

## 2022-08-07 DIAGNOSIS — M5432 Sciatica, left side: Secondary | ICD-10-CM | POA: Diagnosis not present

## 2022-08-07 DIAGNOSIS — Z681 Body mass index (BMI) 19 or less, adult: Secondary | ICD-10-CM | POA: Diagnosis not present

## 2022-08-07 DIAGNOSIS — M48062 Spinal stenosis, lumbar region with neurogenic claudication: Secondary | ICD-10-CM | POA: Diagnosis not present

## 2022-08-07 DIAGNOSIS — M4726 Other spondylosis with radiculopathy, lumbar region: Secondary | ICD-10-CM | POA: Diagnosis not present

## 2022-08-15 ENCOUNTER — Ambulatory Visit: Payer: Self-pay

## 2022-08-15 ENCOUNTER — Telehealth: Payer: Self-pay | Admitting: Family Medicine

## 2022-08-15 NOTE — Telephone Encounter (Signed)
Patient's daughter called in checking on if med for covid will be sent to pharmacy, They spoke ith NT earlier for dx of covid. Asking for it to be sent to  [facility] for today. [address], [place], [place] [address] [phone]

## 2022-08-15 NOTE — Telephone Encounter (Signed)
  Chief Complaint: COVID positive  Symptoms: cough and congestion, body aches, SOB at times and fever  Frequency: Tuesday  Pertinent Negatives: NA Disposition: '[]'$ ED /'[]'$ Urgent Care (no appt availability in office) / '[]'$ Appointment(In office/virtual)/ '[]'$  [place] Virtual Care/ '[]'$ Home Care/ '[]'$ Refused Recommended Disposition /'[]'$ [place] [facility]/ '[x]'$  Follow-up with PCP Additional Notes: pt's daughter in law calling to report pt just tested positive for COVID and symptomatic. No appts available today and pt doesn't have access to Mychart account. She is asking if any way antiviral can be sent in since pt was exposed and whole family has [place]. Advised I would send message to provider and see and have his nurse FU. CB # [phone].   Reason for Disposition  [1] HIGH RISK patient (e.g., weak immune system, age > 75 years, obesity with BMI 30 or higher, pregnant, chronic lung disease or other chronic medical condition) AND [2] COVID symptoms (e.g., cough, fever)  (Exceptions: Already seen by PCP and no new or worsening symptoms.)  Answer Assessment - Initial Assessment Questions 1. COVID-19 DIAGNOSIS: "How do you know that you have COVID?" (e.g., positive lab test or self-test, diagnosed by doctor or NP/PA, symptoms after exposure).     Home test today  2. COVID-19 EXPOSURE: "Was there any known exposure to COVID before the symptoms began?" CDC Definition of close contact: within 6 feet (2 meters) for a total of 15 minutes or more over a 24-hour period.      Family exposure 3. ONSET: "When did the COVID-19 symptoms start?"      Tuesday  5. COUGH: "Do you have a cough?" If Yes, ask: "How bad is the cough?"       yes 6. FEVER: "Do you have a fever?" If Yes, ask: "What is your temperature, how was it measured, and when did it start?"     Yes  7. RESPIRATORY STATUS: "Describe your breathing?" (e.g., normal; shortness of breath, wheezing, unable to speak)      SOB at times  9. OTHER SYMPTOMS: "Do  you have any other symptoms?"  (e.g., chills, fatigue, headache, loss of smell or taste, muscle pain, sore throat)     Cough and congestion, SOB and fever, body aches  10. HIGH RISK DISEASE: "Do you have any chronic medical problems?" (e.g., asthma, heart or lung disease, weak immune system, obesity, etc.)       Asthma, HTN, COPD 11. VACCINE: "Have you had the COVID-19 vaccine?" If Yes, ask: "Which one, how many shots, when did you get it?"       yes  Protocols used: Coronavirus (COVID-19) Diagnosed or Suspected-A-AH

## 2022-08-19 ENCOUNTER — Other Ambulatory Visit: Payer: Self-pay | Admitting: Family Medicine

## 2022-08-19 DIAGNOSIS — E1169 Type 2 diabetes mellitus with other specified complication: Secondary | ICD-10-CM

## 2022-08-19 NOTE — Telephone Encounter (Signed)
Duplicate Message

## 2022-08-19 NOTE — Telephone Encounter (Signed)
There was a previous telephone triage message in also from 12/29  It appears it unfortunately did not make it to my inbox until this morning.  I just attempted to call the patient's # and did not reach them.  If you can reach patient - let me know if he still needs medication or assistance.  Virtual Care Visit for [place] would have been appropriate instead in future.  Nobie Putnam, DO [facility] 08/19/2022, 8:47 AM

## 2022-08-20 ENCOUNTER — Telehealth: Payer: Self-pay | Admitting: Family Medicine

## 2022-08-20 ENCOUNTER — Other Ambulatory Visit: Payer: Self-pay | Admitting: Family Medicine

## 2022-08-20 DIAGNOSIS — E1169 Type 2 diabetes mellitus with other specified complication: Secondary | ICD-10-CM

## 2022-08-20 NOTE — Telephone Encounter (Signed)
Requested Prescriptions  Pending Prescriptions Disp Refills   metFORMIN (GLUCOPHAGE-XR) 500 MG 24 hr tablet [Pharmacy Med Name: METFORMIN ER 500MG 24HR TABS] 360 tablet 0    Sig: TAKE 2 TABLETS BY MOUTH TWICE DAILY     Endocrinology:  Diabetes - Biguanides Failed - 08/19/2022 10:05 AM      Failed - eGFR in normal range and within 360 days    GFR, Est African American  Date Value Ref Range Status  04/05/2020 93 > OR = 60 mL/min/1.61m Final   GFR, Est Non African American  Date Value Ref Range Status  04/05/2020 80 > OR = 60 mL/min/1.713mFinal   eGFR  Date Value Ref Range Status  06/20/2021 86 > OR = 60 mL/min/1.73104minal    Comment:    The eGFR is based on the CKD-EPI 2021 equation. To calculate  the new eGFR from a previous Creatinine or Cystatin C result, go to https://www.kidney.org/professionals/ kdoqi/gfr%5Fcalculator          Failed - B12 Level in normal range and within 720 days    No results found for: "VITAMINB12"       Passed - Cr in normal range and within 360 days    Creat  Date Value Ref Range Status  07/08/2022 0.84 0.70 - 1.22 mg/dL Final   Creatinine, Urine  Date Value Ref Range Status  07/16/2022 46 20 - 320 mg/dL Final         Passed - HBA1C is between 0 and 7.9 and within 180 days    Hemoglobin A1C  Date Value Ref Range Status  09/29/2019 6.9  Final    Comment:    [facility]   Hgb A1c MFr Bld  Date Value Ref Range Status  07/08/2022 7.8 (H) <5.7 % of total Hgb Final    Comment:    For someone without known diabetes, a hemoglobin A1c value of 6.5% or greater indicates that they may have  diabetes and this should be confirmed with a follow-up  test. . For someone with known diabetes, a value <7% indicates  that their diabetes is well controlled and a value  greater than or equal to 7% indicates suboptimal  control. A1c targets should be individualized based on  duration of diabetes, age, comorbid conditions, and  other  considerations. . Currently, no consensus exists regarding use of hemoglobin A1c for diagnosis of diabetes for children. .  Renella CunasValid encounter within last 6 months    Recent Outpatient Visits           1 month ago Annual physical exam   [facility]   11 months ago Uncircumcised male   [facility] Doughty[facility]   1 year ago Annual physical exam   [facility]   1 year ago Needs flu shot   [place]   1 year ago COPD with acute exacerbation ([facility] Hauser[facility]       Future Appointments             In 2 weeks BraHollice EspyD [facility] [place]   In 2 months KarPar[facility]leShort [facility] CenterECMowerthin normal limits and completed  in the last 12 months    WBC  Date Value Ref Range Status  07/08/2022 6.7 3.8 - 10.8 Thousand/uL Final   RBC  Date Value Ref Range Status  07/08/2022 4.61 4.20 - 5.80 Million/uL Final   Hemoglobin  Date Value Ref Range Status  07/08/2022 13.4 13.2 - 17.1 g/dL Final   HCT  Date Value Ref Range Status  07/08/2022 40.4 38.5 - 50.0 % Final   MCHC  Date Value Ref Range Status  07/08/2022 33.2 32.0 - 36.0 g/dL Final   [facility]  Date Value Ref Range Status  07/08/2022 29.1 27.0 - 33.0 pg Final   MCV  Date Value Ref Range Status  07/08/2022 87.6 80.0 - 100.0 fL Final   No results found for: "PLTCOUNTKUC", "LABPLAT", "POCPLA" RDW  Date Value Ref Range Status  07/08/2022 12.9 11.0 - 15.0 % Final          ONETOUCH ULTRA test strip [Pharmacy Med Name: [place] TESTST(NEW)100] 100 strip 0    Sig: USE TO [place]     Endocrinology: Diabetes - Testing Supplies Passed - 08/19/2022 10:05 AM      Passed - Valid encounter  within last 12 months    Recent Outpatient Visits           1 month ago Annual physical exam   [facility] Olin Hauser, DO   11 months ago Uncircumcised male   [place], Devonne Doughty, DO   1 year ago Annual physical exam   [facility] Olin Hauser, DO   1 year ago Needs flu shot   [place], DO   1 year ago COPD with acute exacerbation [facility])   [facility] Parks Ranger, Devonne Doughty, DO       Future Appointments             In 2 weeks Hollice Espy, MD [facility] [place]   In 2 months Parks Ranger, Devonne Doughty, [facility], [facility]

## 2022-08-20 NOTE — Telephone Encounter (Signed)
Pt came in to the office stating that he need all of his medication sent to the pharmacy did not know which ones.  Pharm:  [facility] [address], [place], [place] [address]

## 2022-08-21 NOTE — Telephone Encounter (Signed)
Unable to refill per protocol, Rx request is too soon. Last refill 08/20/21 for 90 days. Will refuse duplicate request.  Requested Prescriptions  Pending Prescriptions Disp Refills   metFORMIN (GLUCOPHAGE-XR) 500 MG 24 hr tablet [Pharmacy Med Name: METFORMIN ER 500MG 24HR TABS] 360 tablet 0    Sig: TAKE 2 TABLETS BY MOUTH TWICE DAILY     Endocrinology:  Diabetes - Biguanides Failed - 08/20/2022 10:43 AM      Failed - eGFR in normal range and within 360 days    GFR, Est African American  Date Value Ref Range Status  04/05/2020 93 > OR = 60 mL/min/1.53m Final   GFR, Est Non African American  Date Value Ref Range Status  04/05/2020 80 > OR = 60 mL/min/1.750mFinal   eGFR  Date Value Ref Range Status  06/20/2021 86 > OR = 60 mL/min/1.7372minal    Comment:    The eGFR is based on the CKD-EPI 2021 equation. To calculate  the new eGFR from a previous Creatinine or Cystatin C result, go to https://www.kidney.org/professionals/ kdoqi/gfr%5Fcalculator          Failed - B12 Level in normal range and within 720 days    No results found for: "VITAMINB12"       Passed - Cr in normal range and within 360 days    Creat  Date Value Ref Range Status  07/08/2022 0.84 0.70 - 1.22 mg/dL Final   Creatinine, Urine  Date Value Ref Range Status  07/16/2022 46 20 - 320 mg/dL Final         Passed - HBA1C is between 0 and 7.9 and within 180 days    Hemoglobin A1C  Date Value Ref Range Status  09/29/2019 6.9  Final    Comment:    [facility]   Hgb A1c MFr Bld  Date Value Ref Range Status  07/08/2022 7.8 (H) <5.7 % of total Hgb Final    Comment:    For someone without known diabetes, a hemoglobin A1c value of 6.5% or greater indicates that they may have  diabetes and this should be confirmed with a follow-up  test. . For someone with known diabetes, a value <7% indicates  that their diabetes is well controlled and a value  greater than or equal to 7% indicates suboptimal   control. A1c targets should be individualized based on  duration of diabetes, age, comorbid conditions, and  other considerations. . Currently, no consensus exists regarding use of hemoglobin A1c for diagnosis of diabetes for children. .  Renella CunasValid encounter within last 6 months    Recent Outpatient Visits           1 month ago Annual physical exam   [facility]   11 months ago Uncircumcised male   [facility]le[facility]Devonne DoughtyO   1 year ago Annual physical exam   [facility]   1 year ago Needs flu shot   [place]   1 year ago COPD with acute exacerbation ([facility]erO       Future Appointments             In 2 weeks BraHollice EspyD [facility] [place]   In 2 months KarParks RangerleDevonne DoughtyO [facility] [place] within normal limits and completed in the last 12 months    WBC  Date Value Ref Range Status  07/08/2022 6.7 3.8 - 10.8 Thousand/uL Final   RBC  Date Value Ref Range Status  07/08/2022 4.61 4.20 - 5.80 Million/uL Final   Hemoglobin  Date Value Ref Range Status  07/08/2022 13.4 13.2 - 17.1 g/dL Final   HCT  Date Value Ref Range Status  07/08/2022 40.4 38.5 - 50.0 % Final   MCHC  Date Value Ref Range Status  07/08/2022 33.2 32.0 - 36.0 g/dL Final   [facility]  Date Value Ref Range Status  07/08/2022 29.1 27.0 - 33.0 pg Final   MCV  Date Value Ref Range Status  07/08/2022 87.6 80.0 - 100.0 fL Final   No results found for: "PLTCOUNTKUC", "LABPLAT", "POCPLA" RDW  Date Value Ref Range Status  07/08/2022 12.9 11.0 - 15.0 % Final

## 2022-08-29 ENCOUNTER — Ambulatory Visit
Admission: RE | Admit: 2022-08-29 | Discharge: 2022-08-29 | Disposition: A | Discharge status: Home / Self Care | Payer: Medicare Other | Source: Ambulatory Visit | Attending: Urology | Admitting: Urology

## 2022-08-29 DIAGNOSIS — N2889 Other specified disorders of kidney and ureter: Secondary | ICD-10-CM | POA: Insufficient documentation

## 2022-08-29 DIAGNOSIS — N281 Cyst of kidney, acquired: Secondary | ICD-10-CM | POA: Diagnosis not present

## 2022-08-29 MED ORDER — GADOBUTROL 1 MMOL/ML IV SOLN
6.0000 mL | Freq: Once | INTRAVENOUS | Status: AC | PRN
  Administered 2022-08-29: 6 mL via INTRAVENOUS

## 2022-09-04 DIAGNOSIS — L570 Actinic keratosis: Secondary | ICD-10-CM | POA: Diagnosis not present

## 2022-09-04 DIAGNOSIS — D485 Neoplasm of uncertain behavior of skin: Secondary | ICD-10-CM | POA: Diagnosis not present

## 2022-09-04 DIAGNOSIS — C44529 Squamous cell carcinoma of skin of other part of trunk: Secondary | ICD-10-CM | POA: Diagnosis not present

## 2022-09-04 DIAGNOSIS — C44629 Squamous cell carcinoma of skin of left upper limb, including shoulder: Secondary | ICD-10-CM | POA: Diagnosis not present

## 2022-09-04 DIAGNOSIS — X32XXXA Exposure to sunlight, initial encounter: Secondary | ICD-10-CM | POA: Diagnosis not present

## 2022-09-09 ENCOUNTER — Ambulatory Visit: Payer: Medicare Other | Admitting: Urology

## 2022-09-09 ENCOUNTER — Encounter: Payer: Self-pay | Admitting: Urology

## 2022-09-09 VITALS — BP 148/81 | HR 111 | Ht 67.0 in | Wt 135.0 lb

## 2022-09-09 DIAGNOSIS — N2889 Other specified disorders of kidney and ureter: Secondary | ICD-10-CM | POA: Diagnosis not present

## 2022-09-09 NOTE — Progress Notes (Signed)
I, Jeanmarie Hubert Maxie,acting as a scribe for Hollice Espy, MD.,have documented all relevant documentation on the behalf of Hollice Espy, MD,as directed by  Hollice Espy, MD while in the presence of Hollice Espy, MD.   09/09/22 12:19 PM   Stephen Savage Dec 11, 1940 [id number]  Referring provider: Olin Hauser, DO [address] [place],  [place] [address]  Chief Complaint  Patient presents with   Follow-up    HPI: 82 year-old male with an incidental renal mass who presents today for a follow up.  He underwent a MR lumbar spine wo contrast on 02/20/2022 for further evaluation of left-sided low back pain extending into leg. It visualized indeterminate Indeterminate cystic lesion in the interpolar region of the right kidney. He underwent an MRI of abdomen on 03/07/2022 to further evaluate that visualized a 2.9 cm diffusely enhancing mass in the upper pole right kidney, right renal cyst, tiny likely cyst in the right hepatic lobe, and a small right adrenal adenoma.   Personally reviewed MR of abdomen on 08/29/22 which showed no interval changes.   He is doing well overall with no new complaints.  PMH: Past Medical History:  Diagnosis Date   COPD (chronic obstructive pulmonary disease) (HCC)    Hyperlipidemia    Hypertension    Knee pain     Home Medications:  Allergies as of 09/09/2022       Reactions   Codeine Nausea Only, Nausea And Vomiting        Medication List        Accurate as of September 09, 2022 12:19 PM. If you have any questions, ask your nurse or doctor.          STOP taking these medications    baclofen 10 MG tablet Commonly known as: LIORESAL Stopped by: Hollice Espy, MD   Januvia 100 MG tablet Generic drug: sitaGLIPtin Stopped by: Hollice Espy, MD   nystatin-triamcinolone ointment Commonly known as: MYCOLOG Stopped by: Hollice Espy, MD       TAKE these medications    amLODipine 10 MG tablet Commonly known as:  NORVASC TAKE ONE TABLET BY MOUTH ONCE DAILY   aspirin EC 81 MG tablet Take 81 mg by mouth daily. Swallow whole.   gabapentin 300 MG capsule Commonly known as: NEURONTIN Take 300 mg by mouth daily.   lisinopril 40 MG tablet Commonly known as: ZESTRIL Take 1 tablet (40 mg total) by mouth daily.   metFORMIN 500 MG 24 hr tablet Commonly known as: GLUCOPHAGE-XR TAKE 2 TABLETS BY MOUTH TWICE DAILY   omeprazole 20 MG capsule Commonly known as: PRILOSEC TAKE 1 CAPSULE BY MOUTH ONCE DAILY BEFORE BREAKFAST   OneTouch Ultra test strip Generic drug: glucose blood USE TO CHECK BLOOD SUGAR TWICE DAILY   simvastatin 20 MG tablet Commonly known as: ZOCOR TAKE ONE TABLET BY MOUTH AT BEDTIME   Symbicort 80-4.5 MCG/ACT inhaler Generic drug: budesonide-formoterol INHALE 2 PUFFS INTO THE LUNGS TWICE DAILY        Allergies:  Allergies  Allergen Reactions   Codeine Nausea Only and Nausea And Vomiting    Family History: Family History  Problem Relation Age of Onset   Heart disease Mother 84   Heart attack Mother    Diabetes Father 57    Social History:  reports that he has been smoking cigarettes. He has a 60.00 pack-year smoking history. His smokeless tobacco use includes chew. He reports that he does not currently use alcohol. He reports that he does  not use drugs.   Physical Exam: BP (!) 148/81   Pulse (!) 111   Ht '5\' 7"'$  (1.702 m)   Wt 135 lb (61.2 kg)   BMI 21.14 kg/m   Constitutional:  Alert and oriented, No acute distress. HEENT: [place] AT, moist mucus membranes.  Trachea midline, no masses. Neurologic: Grossly intact, no focal deficits, moving all 4 extremities. Psychiatric: Normal mood and affect.  Pertinent Imaging: EXAM: MRI ABDOMEN WITHOUT AND WITH CONTRAST   TECHNIQUE: Multiplanar multisequence MR imaging of the abdomen was performed both before and after the administration of intravenous contrast.   CONTRAST:  40m GADAVIST GADOBUTROL 1 MMOL/ML IV SOLN    COMPARISON:  03/07/2022   FINDINGS: Lower chest: No acute findings.   Hepatobiliary: No hepatic masses identified. Gallbladder is unremarkable. No evidence of biliary ductal dilatation.   Pancreas:  No mass or inflammatory changes.   Spleen:  Within normal limits in size and appearance.   Adrenals/Urinary Tract: Enhancing solid mass is seen in the posterior upper pole of the right kidney, measuring 2.8 x 2.7 cm. This shows no significant change compared to previous study. Small benign cyst again noted in lower pole of right kidney. Left kidney is normal in appearance. No evidence of hydronephrosis.   Stomach/Bowel: Unremarkable.   Vascular/Lymphatic: No pathologically enlarged lymph nodes identified. No acute vascular findings.   Other:  None.   Musculoskeletal:  No suspicious bone lesions identified.   IMPRESSION: 2.8 cm solid hyperenhancing mass in the upper pole of the right kidney shows no significant change, but remains highly suspicious for renal cell carcinoma   No evidence of abdominal metastatic disease.   Electronically Signed   By: JMarlaine HindM.D.   On: 08/29/2022 13:59  Personally reviewed and agree with radiologic interpretation.   Assessment & Plan:    Right renal mass - Stable. Likely RCC, but given lack of interval growth, indolent, given his age and comorbidities. Agree with continued conservative approach in the form of active surveillance. We'll plan for an MR of the abdomen pelvis in 12 months.  Return in about 1 year (around 09/10/2023) for with MR of the abdomen.   [address] SLeforsBEvaro [place] [phone]

## 2022-09-14 ENCOUNTER — Other Ambulatory Visit: Payer: Self-pay | Admitting: Family Medicine

## 2022-09-15 NOTE — Telephone Encounter (Signed)
Unable to refill per protocol, Rx request is too soon.  Requested Prescriptions  Pending Prescriptions Disp Refills   SYMBICORT 80-4.5 MCG/ACT inhaler [Pharmacy Med Name: [place] 80/4.5MCG (120  ORAL INH)] 10.2 g 0    Sig: INHALE 2 PUFFS INTO THE LUNGS TWICE DAILY     Pulmonology:  Combination Products Passed - 09/14/2022 11:16 AM      Passed - Valid encounter within last 12 months    Recent Outpatient Visits           2 months ago Annual physical exam   [facility], Devonne Doughty, DO   1 year ago Uncircumcised male   [facility] Olin Hauser, DO   1 year ago Annual physical exam   [facility] Olin Hauser, DO   1 year ago Needs flu shot   [facility] Olin Hauser, DO   1 year ago COPD with acute exacerbation [facility])   [facility] Olin Hauser, DO       Future Appointments             In 2 months Parks Ranger, Devonne Doughty, DO [facility], [facility]

## 2022-09-20 ENCOUNTER — Other Ambulatory Visit: Payer: Self-pay | Admitting: Family Medicine

## 2022-09-20 DIAGNOSIS — E1169 Type 2 diabetes mellitus with other specified complication: Secondary | ICD-10-CM

## 2022-09-20 DIAGNOSIS — I10 Essential (primary) hypertension: Secondary | ICD-10-CM

## 2022-09-20 DIAGNOSIS — K219 Gastro-esophageal reflux disease without esophagitis: Secondary | ICD-10-CM

## 2022-09-21 ENCOUNTER — Other Ambulatory Visit: Payer: Self-pay | Admitting: Family Medicine

## 2022-09-21 DIAGNOSIS — E1169 Type 2 diabetes mellitus with other specified complication: Secondary | ICD-10-CM

## 2022-09-21 DIAGNOSIS — K219 Gastro-esophageal reflux disease without esophagitis: Secondary | ICD-10-CM

## 2022-09-21 DIAGNOSIS — I10 Essential (primary) hypertension: Secondary | ICD-10-CM

## 2022-09-22 NOTE — Telephone Encounter (Signed)
Requested by interface surescripts. Duplicate request. [employer] and patient has already picked up Rx.  Requested Prescriptions  Refused Prescriptions Disp Refills   metFORMIN (GLUCOPHAGE-XR) 500 MG 24 hr tablet [Pharmacy Med Name: METFORMIN ER '500MG'$  24HR TABS] 360 tablet 0    Sig: TAKE 2 TABLETS BY MOUTH TWICE DAILY     Endocrinology:  Diabetes - Biguanides Failed - 09/21/2022 10:49 AM      Failed - eGFR in normal range and within 360 days    GFR, Est African American  Date Value Ref Range Status  04/05/2020 93 > OR = 60 mL/min/1.81m Final   GFR, Est Non African American  Date Value Ref Range Status  04/05/2020 80 > OR = 60 mL/min/1.764mFinal   eGFR  Date Value Ref Range Status  06/20/2021 86 > OR = 60 mL/min/1.7372minal    Comment:    The eGFR is based on the CKD-EPI 2021 equation. To calculate  the new eGFR from a previous Creatinine or Cystatin C result, go to https://www.kidney.org/professionals/ kdoqi/gfr%5Fcalculator          Failed - B12 Level in normal range and within 720 days    No results found for: "VITAMINB12"       Passed - Cr in normal range and within 360 days    Creat  Date Value Ref Range Status  07/08/2022 0.84 0.70 - 1.22 mg/dL Final   Creatinine, Urine  Date Value Ref Range Status  07/16/2022 46 20 - 320 mg/dL Final         Passed - HBA1C is between 0 and 7.9 and within 180 days    Hemoglobin A1C  Date Value Ref Range Status  09/29/2019 6.9  Final    Comment:    [facility]   Hgb A1c MFr Bld  Date Value Ref Range Status  07/08/2022 7.8 (H) <5.7 % of total Hgb Final    Comment:    For someone without known diabetes, a hemoglobin A1c value of 6.5% or greater indicates that they may have  diabetes and this should be confirmed with a follow-up  test. . For someone with known diabetes, a value <7% indicates  that their diabetes is well controlled and a value  greater than or equal to 7% indicates suboptimal   control. A1c targets should be individualized based on  duration of diabetes, age, comorbid conditions, and  other considerations. . Currently, no consensus exists regarding use of hemoglobin A1c for diagnosis of diabetes for children. .  Renella CunasValid encounter within last 6 months    Recent Outpatient Visits           2 months ago Annual physical exam   [facility]   1 year ago Uncircumcised male   [facility]   1 year ago Annual physical exam   [facility]   1 year ago Needs flu shot   [facility]   1 year ago COPD with acute exacerbation ([facility]O       Future Appointments             In 1 month KarParks RangerleDevonne DoughtyO [facility]  Passed - CBC within normal limits and completed in the last 12 months    WBC  Date Value Ref Range Status  07/08/2022 6.7 3.8 - 10.8 Thousand/uL Final   RBC  Date Value Ref Range Status  07/08/2022 4.61 4.20 - 5.80 Million/uL Final   Hemoglobin  Date Value Ref Range Status  07/08/2022 13.4 13.2 - 17.1 g/dL Final   HCT  Date Value Ref Range Status  07/08/2022 40.4 38.5 - 50.0 % Final   MCHC  Date Value Ref Range Status  07/08/2022 33.2 32.0 - 36.0 g/dL Final   [facility]  Date Value Ref Range Status  07/08/2022 29.1 27.0 - 33.0 pg Final   MCV  Date Value Ref Range Status  07/08/2022 87.6 80.0 - 100.0 fL Final   No results found for: "PLTCOUNTKUC", "LABPLAT", "POCPLA" RDW  Date Value Ref Range Status  07/08/2022 12.9 11.0 - 15.0 % Final          lisinopril (ZESTRIL) 40 MG tablet [Pharmacy Med Name: LISINOPRIL '40MG'$  TABLETS] 90 tablet 3    Sig: TAKE ONE TABLET BY MOUTH ONCE  DAILY     Cardiovascular:  ACE Inhibitors Failed - 09/21/2022 10:49 AM      Failed - Last BP in normal range    BP Readings from Last 1 Encounters:  09/09/22 (!) 148/81         Passed - Cr in normal range and within 180 days    Creat  Date Value Ref Range Status  07/08/2022 0.84 0.70 - 1.22 mg/dL Final   Creatinine, Urine  Date Value Ref Range Status  07/16/2022 46 20 - 320 mg/dL Final         Passed - K in normal range and within 180 days    Potassium  Date Value Ref Range Status  07/08/2022 4.9 3.5 - 5.3 mmol/L Final         Passed - Patient is not pregnant      Passed - Valid encounter within last 6 months    Recent Outpatient Visits           2 months ago Annual physical exam   [place], [place], DO   1 year ago Uncircumcised male   [facility] Olin Hauser, DO   1 year ago Annual physical exam   [place], DO   1 year ago Needs flu shot   [place], DO   1 year ago COPD with acute exacerbation [facility])   [place], DO       Future Appointments             In 1 month Parks Ranger, [facility], [facility]             omeprazole (PRILOSEC) 20 MG capsule [Pharmacy Med Name: OMEPRAZOLE '20MG'$  CAPSULES] 90 capsule 0    Sig: TAKE 1 CAPSULE BY MOUTH EVERY [place]     Gastroenterology: Proton Pump Inhibitors Passed - 09/21/2022 10:49 AM      Passed - Valid encounter within last 12 months    Recent Outpatient Visits           2 months ago Annual physical exam   [facility], Devonne Doughty, DO   1 year ago  Uncircumcised male   [facility] Parks [place], Devonne Doughty, DO   1 year ago Annual physical exam    [facility] Olin Hauser, DO   1 year ago Needs flu shot   [facility] Olin Hauser, DO   1 year ago COPD with acute exacerbation [facility])   [facility] Olin Hauser, DO       Future Appointments             In 1 month Parks Ranger, Devonne Doughty, [facility], [facility]

## 2022-09-22 NOTE — Telephone Encounter (Signed)
Requested by interface surescripts. Last refill 08/20/22. Too soon for refill. Future visit in 1 month. Requested Prescriptions  Signed Prescriptions Disp Refills   lisinopril (ZESTRIL) 40 MG tablet 90 tablet 0    Sig: TAKE ONE TABLET BY MOUTH ONCE DAILY     Cardiovascular:  ACE Inhibitors Failed - 09/20/2022  6:26 AM      Failed - Last BP in normal range    BP Readings from Last 1 Encounters:  09/09/22 (!) 148/81         Passed - Cr in normal range and within 180 days    Creat  Date Value Ref Range Status  07/08/2022 0.84 0.70 - 1.22 mg/dL Final   Creatinine, Urine  Date Value Ref Range Status  07/16/2022 46 20 - 320 mg/dL Final         Passed - K in normal range and within 180 days    Potassium  Date Value Ref Range Status  07/08/2022 4.9 3.5 - 5.3 mmol/L Final         Passed - Patient is not pregnant      Passed - Valid encounter within last 6 months    Recent Outpatient Visits           2 months ago Annual physical exam   [place], [place], DO   1 year ago Uncircumcised male   [facility] Parks Ranger, Devonne Doughty, DO   1 year ago Annual physical exam   [facility] Olin Hauser, DO   1 year ago Needs flu shot   [facility] Olin Hauser, DO   1 year ago COPD with acute exacerbation [facility])   [place], DO       Future Appointments             In 1 month Parks Ranger, [facility]             omeprazole (PRILOSEC) 20 MG capsule 90 capsule 0    Sig: TAKE 1 CAPSULE BY MOUTH EVERY DAY BEFORE BREAKFAST     Gastroenterology: Proton Pump Inhibitors Passed - 09/20/2022  6:26 AM      Passed - Valid encounter within last 12 months    Recent Outpatient Visits           2 months ago Annual physical exam   [facility] Olin Hauser, DO   1 year ago Uncircumcised male   [place], DO   1 year ago Annual physical exam   [facility] Olin Hauser, DO   1 year ago Needs flu shot   [facility] Olin Hauser, DO   1 year ago COPD with acute exacerbation [facility])   [place], DO       Future Appointments             In 1 month Parks Ranger, [facility]            Refused Prescriptions Disp Refills   metFORMIN (GLUCOPHAGE-XR) 500 MG 24 hr tablet [Pharmacy Med Name: METFORMIN ER '500MG'$  24HR TABS] 360 tablet 0    Sig: TAKE 2  TABLETS BY MOUTH TWICE DAILY     Endocrinology:  Diabetes - Biguanides Failed - 09/20/2022  6:26 AM      Failed - eGFR in normal range and within 360 days    GFR, Est African American  Date Value Ref Range Status  04/05/2020 93 > OR = 60 mL/min/1.109m Final   GFR, Est Non African American  Date Value Ref Range Status  04/05/2020 80 > OR = 60 mL/min/1.763mFinal   eGFR  Date Value Ref Range Status  06/20/2021 86 > OR = 60 mL/min/1.739minal    Comment:    The eGFR is based on the CKD-EPI 2021 equation. To calculate  the new eGFR from a previous Creatinine or Cystatin C result, go to https://www.kidney.org/professionals/ kdoqi/gfr%5Fcalculator          Failed - B12 Level in normal range and within 720 days    No results found for: "VITAMINB12"       Passed - Cr in normal range and within 360 days    Creat  Date Value Ref Range Status  07/08/2022 0.84 0.70 - 1.22 mg/dL Final   Creatinine, Urine  Date Value Ref Range Status  07/16/2022 46 20 - 320 mg/dL Final         Passed - HBA1C is between 0 and 7.9 and within 180 days    Hemoglobin A1C  Date Value Ref Range Status  09/29/2019 6.9   Final    Comment:    [facility]   Hgb A1c MFr Bld  Date Value Ref Range Status  07/08/2022 7.8 (H) <5.7 % of total Hgb Final    Comment:    For someone without known diabetes, a hemoglobin A1c value of 6.5% or greater indicates that they may have  diabetes and this should be confirmed with a follow-up  test. . For someone with known diabetes, a value <7% indicates  that their diabetes is well controlled and a value  greater than or equal to 7% indicates suboptimal  control. A1c targets should be individualized based on  duration of diabetes, age, comorbid conditions, and  other considerations. . Currently, no consensus exists regarding use of hemoglobin A1c for diagnosis of diabetes for children. .  Renella CunasValid encounter within last 6 months    Recent Outpatient Visits           2 months ago [facility]   [facility]   1 year ago [facility]circumc[facility]   [facility]   1 year ago Annual physical exam   [facility]   1 year ago Needs flu shot   [facility]   1 year ago [facility] acute exacerbation ([facility]       Future Appointments             In 1 month [facility] normal limits and completed in the last 12 months    WBC  Date Value Ref Range Status  07/08/2022 6.7 3.8 - 10.8 Thousand/uL Final   RBC  Date Value Ref Range Status  07/08/2022 4.61 4.20 - 5.80 Million/uL Final   Hemoglobin  Date Value Ref Range Status  07/08/2022 13.4 13.2 - 17.1 g/dL Final   HCT  Date Value Ref Range Status  07/08/2022 40.4 38.5 - 50.0 % Final   MCHC  Date  Value Ref Range Status  07/08/2022 33.2 32.0 - 36.0 g/dL Final   [facility]  Date Value Ref Range Status  07/08/2022 29.1 27.0 - 33.0 pg Final   MCV  Date Value Ref Range Status  07/08/2022 87.6 80.0 - 100.0 fL Final   No results found for: "PLTCOUNTKUC", "LABPLAT", "POCPLA" RDW  Date Value Ref Range Status  07/08/2022 12.9 11.0 - 15.0 % Final

## 2022-09-22 NOTE — Telephone Encounter (Signed)
Future visit in 1 month. Requested Prescriptions  Pending Prescriptions Disp Refills   lisinopril (ZESTRIL) 40 MG tablet [Pharmacy Med Name: LISINOPRIL '40MG'$  TABLETS] 90 tablet 0    Sig: TAKE ONE TABLET BY MOUTH ONCE DAILY     Cardiovascular:  ACE Inhibitors Failed - 09/20/2022  6:26 AM      Failed - Last BP in normal range    BP Readings from Last 1 Encounters:  09/09/22 (!) 148/81         Passed - Cr in normal range and within 180 days    Creat  Date Value Ref Range Status  07/08/2022 0.84 0.70 - 1.22 mg/dL Final   Creatinine, Urine  Date Value Ref Range Status  07/16/2022 46 20 - 320 mg/dL Final         Passed - K in normal range and within 180 days    Potassium  Date Value Ref Range Status  07/08/2022 4.9 3.5 - 5.3 mmol/L Final         Passed - Patient is not pregnant      Passed - Valid encounter within last 6 months    Recent Outpatient Visits           2 months ago Annual physical exam   [place], [place], DO   1 year ago Uncircumcised male   [facility] Parks Ranger, Devonne Doughty, DO   1 year ago Annual physical exam   [facility] Olin Hauser, DO   1 year ago Needs flu shot   [facility] Olin Hauser, DO   1 year ago COPD with acute exacerbation [facility])   [place], DO       Future Appointments             In 1 month Parks Ranger, [facility]             omeprazole (PRILOSEC) 20 MG capsule [Pharmacy Med Name: OMEPRAZOLE '20MG'$  CAPSULES] 90 capsule 0    Sig: TAKE 1 CAPSULE BY MOUTH EVERY [place]     Gastroenterology: Proton Pump Inhibitors Passed - 09/20/2022  6:26 AM      Passed - Valid encounter within last 12 months    Recent Outpatient Visits           2 months ago Annual physical  exam   [facility] Olin Hauser, DO   1 year ago Uncircumcised male   [place], DO   1 year ago Annual physical exam   [facility] Olin Hauser, DO   1 year ago Needs flu shot   [place], DO   1 year ago COPD with acute exacerbation [facility])   [place], DO       Future Appointments             In 1 month Parks Ranger, [facility]             metFORMIN (GLUCOPHAGE-XR) 500 MG 24 hr tablet [Pharmacy Med Name: METFORMIN ER '500MG'$  24HR TABS] 360 tablet 0    Sig: TAKE 2 TABLETS BY MOUTH TWICE  DAILY     Endocrinology:  Diabetes - Biguanides Failed - 09/20/2022  6:26 AM      Failed - eGFR in normal range and within 360 days    GFR, Est African American  Date Value Ref Range Status  04/05/2020 93 > OR = 60 mL/min/1.52m Final   GFR, Est Non African American  Date Value Ref Range Status  04/05/2020 80 > OR = 60 mL/min/1.743mFinal   eGFR  Date Value Ref Range Status  06/20/2021 86 > OR = 60 mL/min/1.7368minal    Comment:    The eGFR is based on the CKD-EPI 2021 equation. To calculate  the new eGFR from a previous Creatinine or Cystatin C result, go to https://www.kidney.org/professionals/ kdoqi/gfr%5Fcalculator          Failed - B12 Level in normal range and within 720 days    No results found for: "VITAMINB12"       Passed - Cr in normal range and within 360 days    Creat  Date Value Ref Range Status  07/08/2022 0.84 0.70 - 1.22 mg/dL Final   Creatinine, Urine  Date Value Ref Range Status  07/16/2022 46 20 - 320 mg/dL Final         Passed - HBA1C is between 0 and 7.9 and within 180 days    Hemoglobin A1C  Date Value Ref Range Status  09/29/2019 6.9  Final    Comment:     [facility]   Hgb A1c MFr Bld  Date Value Ref Range Status  07/08/2022 7.8 (H) <5.7 % of total Hgb Final    Comment:    For someone without known diabetes, a hemoglobin A1c value of 6.5% or greater indicates that they may have  diabetes and this should be confirmed with a follow-up  test. . For someone with known diabetes, a value <7% indicates  that their diabetes is well controlled and a value  greater than or equal to 7% indicates suboptimal  control. A1c targets should be individualized based on  duration of diabetes, age, comorbid conditions, and  other considerations. . Currently, no consensus exists regarding use of hemoglobin A1c for diagnosis of diabetes for children. .  Renella CunasValid encounter within last 6 months    Recent Outpatient Visits           2 months ago Annual physical exam   [facility]   1 year ago Uncircumcised male   [facility]   1 year ago Annual physical exam   [facility]   1 year ago Needs flu shot   [facility]   1 year ago COPD with acute exacerbation ([facility]       Future Appointments             In 1 month KarParks RangerleDevonne DoughtyO [facility] normal limits and completed in the last 12 months    WBC  Date Value Ref Range Status  07/08/2022 6.7 3.8 - 10.8 Thousand/uL Final   RBC  Date Value Ref Range Status  07/08/2022 4.61 4.20 -  5.80 Million/uL Final   Hemoglobin  Date Value Ref Range Status  07/08/2022 13.4 13.2 - 17.1 g/dL Final   HCT  Date Value Ref Range Status  07/08/2022 40.4 38.5 - 50.0 % Final   MCHC  Date Value Ref Range Status   07/08/2022 33.2 32.0 - 36.0 g/dL Final   [facility]  Date Value Ref Range Status  07/08/2022 29.1 27.0 - 33.0 pg Final   MCV  Date Value Ref Range Status  07/08/2022 87.6 80.0 - 100.0 fL Final   No results found for: "PLTCOUNTKUC", "LABPLAT", "POCPLA" RDW  Date Value Ref Range Status  07/08/2022 12.9 11.0 - 15.0 % Final

## 2022-10-16 DIAGNOSIS — X32XXXD Exposure to sunlight, subsequent encounter: Secondary | ICD-10-CM | POA: Diagnosis not present

## 2022-10-16 DIAGNOSIS — Z85828 Personal history of other malignant neoplasm of skin: Secondary | ICD-10-CM | POA: Diagnosis not present

## 2022-10-16 DIAGNOSIS — L980 Pyogenic granuloma: Secondary | ICD-10-CM | POA: Diagnosis not present

## 2022-10-16 DIAGNOSIS — Z08 Encounter for follow-up examination after completed treatment for malignant neoplasm: Secondary | ICD-10-CM | POA: Diagnosis not present

## 2022-10-16 DIAGNOSIS — L570 Actinic keratosis: Secondary | ICD-10-CM | POA: Diagnosis not present

## 2022-11-14 ENCOUNTER — Encounter: Payer: Self-pay | Admitting: Family Medicine

## 2022-11-14 ENCOUNTER — Ambulatory Visit (INDEPENDENT_AMBULATORY_CARE_PROVIDER_SITE_OTHER): Payer: Medicare Other | Admitting: Family Medicine

## 2022-11-14 VITALS — BP 122/66 | HR 77 | Ht 67.0 in | Wt 137.2 lb

## 2022-11-14 DIAGNOSIS — I10 Essential (primary) hypertension: Secondary | ICD-10-CM | POA: Diagnosis not present

## 2022-11-14 DIAGNOSIS — E1169 Type 2 diabetes mellitus with other specified complication: Secondary | ICD-10-CM

## 2022-11-14 DIAGNOSIS — J432 Centrilobular emphysema: Secondary | ICD-10-CM

## 2022-11-14 DIAGNOSIS — K219 Gastro-esophageal reflux disease without esophagitis: Secondary | ICD-10-CM | POA: Diagnosis not present

## 2022-11-14 LAB — POCT GLYCOSYLATED HEMOGLOBIN (HGB A1C): Hemoglobin A1C: 8.2 % — AB (ref 4.0–5.6)

## 2022-11-14 MED ORDER — METFORMIN HCL ER 500 MG PO TB24: 1000.0000 mg | ORAL_TABLET | Freq: Two times a day (BID) | ORAL | 3 refills | Status: DC

## 2022-11-14 MED ORDER — LISINOPRIL 40 MG PO TABS: 40.0000 mg | ORAL_TABLET | Freq: Every day | ORAL | 3 refills | Status: DC

## 2022-11-14 MED ORDER — OMEPRAZOLE 20 MG PO CPDR: 20.0000 mg | DELAYED_RELEASE_CAPSULE | Freq: Every day | ORAL | 3 refills | Status: DC

## 2022-11-14 MED ORDER — SYMBICORT 80-4.5 MCG/ACT IN AERO: 2.0000 | INHALATION_SPRAY | Freq: Two times a day (BID) | RESPIRATORY_TRACT | 11 refills | Status: DC

## 2022-11-14 NOTE — Assessment & Plan Note (Signed)
Well-controlled HTN - Home BP readings  No known complications     Plan:  1. Continue current BP regimen Quinapril 40mg daily, Amlodipine 10mg daily 2. Encourage improved lifestyle - low sodium diet, regular exercise 3. Continue monitor BP outside office, bring readings to next visit, if persistently >140/90 or new symptoms notify office sooner 

## 2022-11-14 NOTE — Progress Notes (Signed)
Subjective:    Patient ID: Stephen Savage, male    DOB: [birth year], 82 y.o.   MRN: [id number]  Stephen Savage is a 82 y.o. male presenting on 11/14/2022 for Diabetes and COPD   HPI  Centrilobular Emphysema Tobacco Abuse Chronic problem, active smoker 1ppd >60 years. Not ready to quit. He has some occasional breathing problem dyspnea or cough. 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 Tolerating well, w/o complaints. Denies CP, dyspnea, HA, edema, dizziness / lightheadedness   CHRONIC DM, Type 2: A1c 8.2 elevated today CBGs: Avg 150+ in AM and 200 after dinner PM. Checks CBGs twice daily Meds: Metformin XR 500mg  x 2 = 1000mg  TWICE A DAY - Unable to get medication approved due to medicare donut hole Januvia, Rybelsus previously Reports good compliance. Tolerating well w/o side-effects Currently on ACEi Controlled on Statin Lifestyle: - Diet (Improved)  - Exercise (Active) DM Eye Exam [facility] due this year 2024 Denies hypoglycemia, polyuria, visual changes, numbness or tingling.   HYPERLIPIDEMIA: - Reports no concerns. Last lipid panel 06/2022, controlled  - Currently taking Simvastatin 20mg , tolerating well without side effects or myalgias   Bilateral Knee Pain chronic, Osteoarthritis Back Pain, lumbar disc Followed by [place] Taking Tylenol        11/14/2022   10:19 AM 07/16/2022    9:35 AM 06/18/2021    1:59 PM  Depression screen PHQ 2/9  Decreased Interest 0 0 0  Down, Depressed, Hopeless 0 0 0  PHQ - 2 Score 0 0 0  Altered sleeping 0    Tired, decreased energy 1    Change in appetite 0    Feeling bad or failure about yourself  0    Trouble concentrating 0    Moving slowly or fidgety/restless 0    Suicidal  thoughts 0    PHQ-9 Score 1    Difficult doing work/chores Not difficult at all      Social History   Tobacco Use   Smoking status: Every Day    Packs/day: 1.00    Years: 60.00    Additional pack years: 0.00    Total pack years: 60.00    Types: Cigarettes   Smokeless tobacco: Current    Types: Chew  Vaping Use   Vaping Use: Former  Substance Use Topics   Alcohol use: Not Currently   Drug use: Never    Review of Systems Per HPI unless specifically indicated above     Objective:    BP 122/66   Pulse 77   Ht 5\' 7"  (1.702 m)   Wt 137 lb 3.2 oz (62.2 kg)   SpO2 96%   BMI 21.49 kg/m   Wt Readings from Last 3 Encounters:  11/14/22 137 lb 3.2 oz (62.2 kg)  09/09/22 135 lb (61.2 kg)  07/22/22 137 lb 6.4 oz (62.3 kg)    Physical Exam Vitals and nursing note reviewed.  Constitutional:      General: He is not in acute distress.    Appearance: He is well-developed. He is not diaphoretic.     Comments: Well-appearing, comfortable, cooperative  HENT:     Head: Normocephalic and atraumatic.  Eyes:     General:        Right  eye: No discharge.        Left eye: No discharge.     Conjunctiva/sclera: Conjunctivae normal.  Neck:     Thyroid: No thyromegaly.  Cardiovascular:     Rate and Rhythm: Normal rate and regular rhythm.     Pulses: Normal pulses.     Heart sounds: Normal heart sounds. No murmur heard. Pulmonary:     Effort: Pulmonary effort is normal. No respiratory distress.     Breath sounds: Normal breath sounds. No wheezing or rales.  Musculoskeletal:        General: Normal range of motion.     Cervical back: Normal range of motion and neck supple.  Lymphadenopathy:     Cervical: No cervical adenopathy.  Skin:    General: Skin is warm and dry.     Findings: No erythema or rash.  Neurological:     Mental Status: He is alert and oriented to person, place, and time. Mental status is at baseline.  Psychiatric:        Behavior: Behavior normal.     Comments:  Well groomed, good eye contact, normal speech and thoughts      Results for orders placed or performed in visit on 11/14/22  POCT HgB A1C  Result Value Ref Range   Hemoglobin A1C 8.2 (A) 4.0 - 5.6 %      Assessment & Plan:   Problem List Items Addressed This Visit     Centrilobular emphysema (HCC)    Chronic Emphysema COPD Stable without flare Continue Symbicort  Offer albuterol PRN Tobacco cessation Follow-up if not improving  He has aged out of LDCT screening age >62 Future X-ray if interested      Relevant Medications   albuterol (VENTOLIN HFA) 108 (90 Base) MCG/ACT inhaler   SYMBICORT 80-4.5 MCG/ACT inhaler   Essential hypertension    Well-controlled HTN - Home BP readings  No known complications    Plan:  1. Continue current BP regimen Quinapril 40mg  daily, Amlodipine 10mg  daily 2. Encourage improved lifestyle - low sodium diet, regular exercise 3. Continue monitor BP outside office, bring readings to next visit, if persistently >140/90 or new symptoms notify office sooner      Relevant Medications   lisinopril (ZESTRIL) 40 MG tablet   Type 2 diabetes mellitus with other specified complication (HCC) - Primary    A1c 8.2 elevated, mostly diet Complications - hyperlipidemia, coronary artery disease- increases risk of future cardiovascular complications  Unable to afford Rybelsus or other med  Plan:  1.- Continue current therapy - metformin XR 500mg  x 2 = 1000mg  BID recently ordered - May discontinue Metformin in future if need - Caution wt loss, he has some natural weight loss as smoker, he is improving this  2. Encourage improved lifestyle - low carb, low sugar diet, reduce portion size, continue improving regular exercise 3. Check CBG , bring log to next visit for review 4. Continue ASA, ACEi, Statin 5. UTD DM Foot, Ophtho recommended, Urine micro      Relevant Medications   lisinopril (ZESTRIL) 40 MG tablet   metFORMIN (GLUCOPHAGE-XR) 500 MG 24 hr  tablet   Other Relevant Orders   POCT HgB A1C (Completed)   Other Visit Diagnoses     Gastroesophageal reflux disease, unspecified whether esophagitis present       Relevant Medications   omeprazole (PRILOSEC) 20 MG capsule        Recommend scheduling with Eye Doctor  [facility] [address] [place], [place]  [place] Phone: [phone] [email or web address]  Refilled medications including Symbicort inhaler.  Sample [place] today. Good for 1 week.  Meds ordered this encounter  Medications   lisinopril (ZESTRIL) 40 MG tablet    Sig: Take 1 tablet (40 mg total) by mouth daily.    Dispense:  90 tablet    Refill:  3   metFORMIN (GLUCOPHAGE-XR) 500 MG 24 hr tablet    Sig: Take 2 tablets (1,000 mg total) by mouth 2 (two) times daily.    Dispense:  360 tablet    Refill:  3   omeprazole (PRILOSEC) 20 MG capsule    Sig: Take 1 capsule (20 mg total) by mouth daily before breakfast.    Dispense:  90 capsule    Refill:  3   SYMBICORT 80-4.5 MCG/ACT inhaler    Sig: Inhale 2 puffs into the lungs 2 (two) times daily.    Dispense:  10.2 g    Refill:  11      Follow up plan: Return in about 6 months (around 05/17/2023) for 6 month DM A1c.  Nobie Putnam, [facility] 11/14/2022, 10:05 AM

## 2022-11-14 NOTE — Assessment & Plan Note (Signed)
Chronic Emphysema COPD Stable without flare Continue Symbicort  Offer albuterol PRN Tobacco cessation Follow-up if not improving  He has aged out of LDCT screening age >18 Future X-ray if interested

## 2022-11-14 NOTE — Assessment & Plan Note (Signed)
A1c 8.2 elevated, mostly diet Complications - hyperlipidemia, coronary artery disease- increases risk of future cardiovascular complications  Unable to afford Rybelsus or other med  Plan:  1.- Continue current therapy - metformin XR 500mg  x 2 = 1000mg  BID recently ordered - May discontinue Metformin in future if need - Caution wt loss, he has some natural weight loss as smoker, he is improving this  2. Encourage improved lifestyle - low carb, low sugar diet, reduce portion size, continue improving regular exercise 3. Check CBG , bring log to next visit for review 4. Continue ASA, ACEi, Statin 5. UTD DM Foot, Ophtho recommended, Urine micro

## 2022-11-14 NOTE — Patient Instructions (Addendum)
Thank you for coming to the office today.  Recent Labs    07/08/22 0812 11/14/22 1005  HGBA1C 7.8* 8.2*   Recommend scheduling with [facility]  [facility] [address] [place], [place] [address] Phone: [phone] [email or web address]  Refilled medications including Symbicort inhaler.  Sample [place] today. Good for 1 week.  Please schedule a Follow-up Appointment to: Return in about 6 months (around 05/17/2023) for 6 month DM A1c.  If you have any other questions or concerns, please feel free to call the office or send a message through [place]. You may also schedule an earlier appointment if necessary.  Additionally, you may be receiving a survey about your experience at our office within a few days to 1 week by e-mail or mail. We value your feedback.  Nobie Putnam, DO [place]

## 2022-12-26 ENCOUNTER — Other Ambulatory Visit: Payer: Self-pay

## 2022-12-26 ENCOUNTER — Emergency Department: Payer: Medicare Other

## 2022-12-26 ENCOUNTER — Emergency Department
Admission: EM | Admit: 2022-12-26 | Discharge: 2022-12-26 | Disposition: A | Discharge status: Home / Self Care | Payer: Medicare Other | Attending: Emergency Medicine | Admitting: Emergency Medicine

## 2022-12-26 DIAGNOSIS — M4802 Spinal stenosis, cervical region: Secondary | ICD-10-CM | POA: Diagnosis not present

## 2022-12-26 DIAGNOSIS — T1490XA Injury, unspecified, initial encounter: Secondary | ICD-10-CM | POA: Diagnosis not present

## 2022-12-26 DIAGNOSIS — R519 Headache, unspecified: Secondary | ICD-10-CM | POA: Diagnosis not present

## 2022-12-26 DIAGNOSIS — R6889 Other general symptoms and signs: Secondary | ICD-10-CM | POA: Diagnosis not present

## 2022-12-26 DIAGNOSIS — I499 Cardiac arrhythmia, unspecified: Secondary | ICD-10-CM | POA: Diagnosis not present

## 2022-12-26 DIAGNOSIS — M545 Low back pain, unspecified: Secondary | ICD-10-CM | POA: Diagnosis not present

## 2022-12-26 DIAGNOSIS — I6782 Cerebral ischemia: Secondary | ICD-10-CM | POA: Diagnosis not present

## 2022-12-26 DIAGNOSIS — S32019A Unspecified fracture of first lumbar vertebra, initial encounter for closed fracture: Secondary | ICD-10-CM | POA: Diagnosis not present

## 2022-12-26 DIAGNOSIS — Y92410 Unspecified street and highway as the place of occurrence of the external cause: Secondary | ICD-10-CM | POA: Insufficient documentation

## 2022-12-26 DIAGNOSIS — J449 Chronic obstructive pulmonary disease, unspecified: Secondary | ICD-10-CM | POA: Diagnosis not present

## 2022-12-26 DIAGNOSIS — S32010A Wedge compression fracture of first lumbar vertebra, initial encounter for closed fracture: Secondary | ICD-10-CM | POA: Diagnosis not present

## 2022-12-26 DIAGNOSIS — M542 Cervicalgia: Secondary | ICD-10-CM | POA: Diagnosis not present

## 2022-12-26 DIAGNOSIS — E119 Type 2 diabetes mellitus without complications: Secondary | ICD-10-CM | POA: Insufficient documentation

## 2022-12-26 DIAGNOSIS — S32020A Wedge compression fracture of second lumbar vertebra, initial encounter for closed fracture: Secondary | ICD-10-CM

## 2022-12-26 DIAGNOSIS — S32029A Unspecified fracture of second lumbar vertebra, initial encounter for closed fracture: Secondary | ICD-10-CM | POA: Diagnosis not present

## 2022-12-26 DIAGNOSIS — S3992XA Unspecified injury of lower back, initial encounter: Secondary | ICD-10-CM | POA: Diagnosis present

## 2022-12-26 DIAGNOSIS — I10 Essential (primary) hypertension: Secondary | ICD-10-CM | POA: Insufficient documentation

## 2022-12-26 DIAGNOSIS — Z743 Need for continuous supervision: Secondary | ICD-10-CM | POA: Diagnosis not present

## 2022-12-26 MED ORDER — ONDANSETRON 4 MG PO TBDP
4.0000 mg | ORAL_TABLET | Freq: Once | ORAL | Status: AC
  Administered 2022-12-26: 4 mg via ORAL
  Filled 2022-12-26: qty 1

## 2022-12-26 MED ORDER — TRAMADOL HCL 50 MG PO TABS: 50.0000 mg | ORAL_TABLET | Freq: Three times a day (TID) | ORAL | 0 refills | Status: AC | PRN

## 2022-12-26 MED ORDER — TRAMADOL HCL 50 MG PO TABS
50.0000 mg | ORAL_TABLET | Freq: Once | ORAL | Status: AC
  Administered 2022-12-26: 50 mg via ORAL
  Filled 2022-12-26: qty 1

## 2022-12-26 MED ORDER — ONDANSETRON 4 MG PO TBDP: 4.0000 mg | ORAL_TABLET | Freq: Three times a day (TID) | ORAL | 0 refills | Status: AC | PRN
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
## 2022-12-26 NOTE — Progress Notes (Signed)
Orthopedic Tech Progress Note Patient Details:  Stephen Savage [birth year] [id number] Called in order to [employer] for TLSO Patient ID: Stephen Savage, male   DOB: [birth year], 82 y.o.   MRN: [id number]  [name] 12/26/2022, 5:44 PM

## 2022-12-26 NOTE — ED Notes (Signed)
Waiting on Ortho vendor to deliver brace
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
## 2023-01-15 DIAGNOSIS — M5137 Other intervertebral disc degeneration, lumbosacral region: Secondary | ICD-10-CM | POA: Diagnosis not present

## 2023-01-15 DIAGNOSIS — E119 Type 2 diabetes mellitus without complications: Secondary | ICD-10-CM | POA: Diagnosis not present

## 2023-01-15 DIAGNOSIS — E1165 Type 2 diabetes mellitus with hyperglycemia: Secondary | ICD-10-CM | POA: Diagnosis not present

## 2023-01-15 DIAGNOSIS — S39012A Strain of muscle, fascia and tendon of lower back, initial encounter: Secondary | ICD-10-CM | POA: Diagnosis not present

## 2023-02-23 ENCOUNTER — Emergency Department: Payer: Medicare Other

## 2023-02-23 ENCOUNTER — Other Ambulatory Visit: Payer: Self-pay

## 2023-02-23 ENCOUNTER — Emergency Department
Admission: EM | Admit: 2023-02-23 | Discharge: 2023-02-23 | Disposition: A | Discharge status: Home / Self Care | Payer: Medicare Other | Attending: Emergency Medicine | Admitting: Emergency Medicine

## 2023-02-23 DIAGNOSIS — I10 Essential (primary) hypertension: Secondary | ICD-10-CM | POA: Diagnosis not present

## 2023-02-23 DIAGNOSIS — R079 Chest pain, unspecified: Secondary | ICD-10-CM | POA: Diagnosis not present

## 2023-02-23 DIAGNOSIS — R0789 Other chest pain: Secondary | ICD-10-CM | POA: Insufficient documentation

## 2023-02-23 DIAGNOSIS — Z87891 Personal history of nicotine dependence: Secondary | ICD-10-CM | POA: Diagnosis not present

## 2023-02-23 DIAGNOSIS — I6782 Cerebral ischemia: Secondary | ICD-10-CM | POA: Diagnosis not present

## 2023-02-23 DIAGNOSIS — R519 Headache, unspecified: Secondary | ICD-10-CM | POA: Diagnosis not present

## 2023-02-23 DIAGNOSIS — J449 Chronic obstructive pulmonary disease, unspecified: Secondary | ICD-10-CM | POA: Insufficient documentation

## 2023-02-23 LAB — BASIC METABOLIC PANEL
Anion gap: 9 (ref 5–15)
BUN: 13 mg/dL (ref 8–23)
CO2: 25 mmol/L (ref 22–32)
Calcium: 9.1 mg/dL (ref 8.9–10.3)
Chloride: 100 mmol/L (ref 98–111)
Creatinine, Ser: 0.89 mg/dL (ref 0.61–1.24)
GFR, Estimated: >60 mL/min (ref >60)
Glucose, Bld: 149 mg/dL — ABNORMAL HIGH (ref 70–99)
Potassium: 3.9 mmol/L (ref 3.5–5.1)
Sodium: 134 mmol/L — ABNORMAL LOW (ref 135–145)

## 2023-02-23 LAB — CBC
HCT: 38.2 % — ABNORMAL LOW (ref 39.0–52.0)
Hemoglobin: 12.3 g/dL — ABNORMAL LOW (ref 13.0–17.0)
MCH: 28.3 pg (ref 26.0–34.0)
MCHC: 32.2 g/dL (ref 30.0–36.0)
MCV: 88 fL (ref 80.0–100.0)
Platelets: 351 10*3/uL (ref 150–400)
RBC: 4.34 MIL/uL (ref 4.22–5.81)
RDW: 14.8 % (ref 11.5–15.5)
WBC: 10.7 10*3/uL — ABNORMAL HIGH (ref 4.0–10.5)
nRBC: 0 % (ref 0.0–0.2)

## 2023-02-23 LAB — TROPONIN I (HIGH SENSITIVITY): Troponin I (High Sensitivity): 5 ng/L (ref <18)

## 2023-02-23 NOTE — ED Notes (Signed)
See triage notes. Patient has been having chest pains on and off since Saturday. Patient also complains of bouts of dizziness.

## 2023-02-23 NOTE — ED Provider Triage Note (Signed)
Emergency Medicine Provider Triage Evaluation Note  Stephen Savage , a 82 y.o. male  was evaluated in triage.  Pt complains of chest pain, headache, dizziness.  Review of Systems  Positive:  Negative:   Physical Exam  BP (!) 149/91   Pulse (!) 105   Temp 98.5 F (36.9 C)   Resp 19   SpO2 93%  Gen:   Awake, no distress   Resp:  Normal effort  MSK:   Moves extremities without difficulty  Other:    Medical Decision Making  Medically screening exam initiated at 1:41 PM.  Appropriate orders placed.  [name] was informed that the remainder of the evaluation will be completed by another provider, this initial triage assessment does not replace that evaluation, and the importance of remaining in the ED until their evaluation is complete.     Faythe Ghee, PA-C 02/23/23 1342

## 2023-02-23 NOTE — ED Provider Notes (Signed)
[facility] Provider Note    Event Date/Time   First MD Initiated Contact with Patient 02/23/23 1554     (approximate)   History   Chest Pain   HPI  Stephen Savage is a 82 y.o. male with a history of COPD, hypertension, cigarette usage who presents with complaints of chest discomfort, fatigue.  Patient reports he started feel this way 2 days ago, he has had intermittent episodes of chest discomfort however none currently and states that he is feeling much better at the moment and would like to have lunch.  Denies cough or fevers.  No leg pain or swelling     Physical Exam   Triage Vital Signs: ED Triage Vitals [02/23/23 1339]  Enc Vitals Group     BP (!) 149/91     Pulse Rate (!) 105     Resp 19     Temp 98.5 F (36.9 C)     Temp src      SpO2 93 %     Weight      Height      Head Circumference      Peak Flow      Pain Score 5     Pain Loc      Pain Edu?      Excl. in GC?     Most recent vital signs: Vitals:   02/23/23 1339  BP: (!) 149/91  Pulse: (!) 105  Resp: 19  Temp: 98.5 F (36.9 C)  SpO2: 93%     General: Awake, no distress.  CV:  Good peripheral perfusion.  Resp:  Normal effort.  Scattered mild wheezes Abd:  No distention.  Other:     ED Results / Procedures / Treatments   Labs (all labs ordered are listed, but only abnormal results are displayed) Labs Reviewed  BASIC METABOLIC PANEL - Abnormal; Notable for the following components:      Result Value   Sodium 134 (*)    Glucose, Bld 149 (*)    All other components within normal limits  CBC - Abnormal; Notable for the following components:   WBC 10.7 (*)    Hemoglobin 12.3 (*)    HCT 38.2 (*)    All other components within normal limits  TROPONIN I (HIGH SENSITIVITY)  TROPONIN I (HIGH SENSITIVITY)     EKG ED ECG REPORT I, Jene Every, the attending physician, personally viewed and interpreted this ECG.  Date: 02/23/2023  Rhythm: normal sinus  rhythm QRS Axis: normal Intervals: normal ST/T Wave abnormalities: normal Narrative Interpretation: no evidence of acute ischemia     RADIOLOGY Chest x-ray viewed interpret by me, no acute abnormality    PROCEDURES:  Critical Care performed:   Procedures   MEDICATIONS ORDERED IN ED: Medications - No data to display   IMPRESSION / MDM / ASSESSMENT AND PLAN / ED COURSE  I reviewed the triage vital signs and the nursing notes. Patient's presentation is most consistent with acute presentation with potential threat to life or bodily function.  Patient with a history of tobacco use, hypertension presents with chest discomfort, fatigue  Differential includes ACS, angina, pneumonia  EKG is reassuring, high sensitive troponin is normal.  Not consistent with ACS.  Chest x-ray without evidence of infiltrate.  No fevers.  Lab work otherwise reassuring.  Possible angina, offered admission to the patient however he declined, he would like to be discharged with outpatient follow-up, cardiology referral placed, strict return precautions, patient and  his daughter agree with this plan        FINAL CLINICAL IMPRESSION(S) / ED DIAGNOSES   Final diagnoses:  Atypical chest pain     Rx / DC Orders   ED Discharge Orders          Ordered    Ambulatory referral to Cardiology       Comments: If you have not heard from the Cardiology office within the next 72 hours please call [phone].   02/23/23 1610             Note:  This document was prepared using Dragon voice recognition software and may include unintentional dictation errors.   Jene Every, MD 02/23/23 [phone]

## 2023-02-23 NOTE — ED Triage Notes (Signed)
Pt comes with c/o cp that started 2 days ago. Pt states left sided pain. Pt states headache. Pt states HTN at home and did take his meds.

## 2023-02-25 ENCOUNTER — Other Ambulatory Visit: Payer: Self-pay | Admitting: Family Medicine

## 2023-02-25 DIAGNOSIS — E1169 Type 2 diabetes mellitus with other specified complication: Secondary | ICD-10-CM
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
## 2023-03-10 DIAGNOSIS — E119 Type 2 diabetes mellitus without complications: Secondary | ICD-10-CM | POA: Diagnosis not present

## 2023-03-10 DIAGNOSIS — H35372 Puckering of macula, left eye: Secondary | ICD-10-CM | POA: Diagnosis not present

## 2023-03-10 DIAGNOSIS — H2513 Age-related nuclear cataract, bilateral: Secondary | ICD-10-CM | POA: Diagnosis not present

## 2023-03-10 LAB — HM DIABETES EYE EXAM

## 2023-04-30 ENCOUNTER — Ambulatory Visit: Payer: Medicare Other | Attending: Cardiology | Admitting: Cardiology

## 2023-04-30 ENCOUNTER — Telehealth: Payer: Self-pay | Admitting: Cardiology

## 2023-04-30 ENCOUNTER — Encounter: Payer: Self-pay | Admitting: Cardiology

## 2023-04-30 VITALS — BP 144/64 | HR 69 | Ht 69.0 in | Wt 125.8 lb

## 2023-04-30 DIAGNOSIS — R072 Precordial pain: Secondary | ICD-10-CM | POA: Diagnosis not present

## 2023-04-30 DIAGNOSIS — F17200 Nicotine dependence, unspecified, uncomplicated: Secondary | ICD-10-CM

## 2023-04-30 DIAGNOSIS — E7800 Pure hypercholesterolemia, unspecified: Secondary | ICD-10-CM | POA: Diagnosis not present

## 2023-04-30 DIAGNOSIS — I10 Essential (primary) hypertension: Secondary | ICD-10-CM

## 2023-04-30 NOTE — Patient Instructions (Signed)
Medication Instructions:   Your physician recommends that you continue on your current medications as directed. Please refer to the Current Medication list given to you today.  *If you need a refill on your cardiac medications before your next appointment, please call your pharmacy*   Lab Work:  None Ordered  If you have labs (blood work) drawn today and your tests are completely normal, you will receive your results only by: MyChart Message (if you have MyChart) OR A paper copy in the mail If you have any lab test that is abnormal or we need to change your treatment, we will call you to review the results.   Testing/Procedures:  Your physician has requested that you have an echocardiogram. Echocardiography is a painless test that uses sound waves to create images of your heart. It provides your doctor with information about the size and shape of your heart and how well your heart's chambers and valves are working. This procedure takes approximately one hour. There are no restrictions for this procedure. Please do NOT wear cologne, perfume, aftershave, or lotions (deodorant is allowed). Please arrive 15 minutes prior to your appointment time.    Follow-Up: At [facility], you and your health needs are our priority.  As part of our continuing mission to provide you with exceptional heart care, we have created designated Provider Care Teams.  These Care Teams include your primary Cardiologist (physician) and Advanced Practice Providers (APPs -  Physician Assistants and Nurse Practitioners) who all work together to provide you with the care you need, when you need it.  We recommend signing up for the patient portal called "MyChart".  Sign up information is provided on this After Visit Summary.  MyChart is used to connect with patients for Virtual Visits (Telemedicine).  Patients are able to view lab/test results, encounter notes, upcoming appointments, etc.  Non-urgent messages can  be sent to your provider as well.   To learn more about what you can do with MyChart, go to [email or web address].    Your next appointment:    After Echocardiogram  Provider:   You may see Brian Agbor-Etang, MD or one of the following Advanced Practice Providers on your designated Care Team:   Christopher Berge, NP Ryan Dunn, PA-C Cadence Furth, PA-C Sheri Hammock, NP 

## 2023-04-30 NOTE — Progress Notes (Signed)
Cardiology Office Note:    Date:  04/30/2023   ID:  Stephen Savage, DOB [birth year], MRN [id number]  PCP:  Stephen Cords, DO   [facility] Providers Cardiologist:  Stephen Odea, MD     Referring MD: Stephen Savage *   Chief Complaint  Patient presents with   New Patient (Initial Visit)    Referred for cardiac evaluation of Atypical chest pain.  No further chest pain episodes since ED visit on 02/23/23.    History of Present Illness:    Stephen Savage is a 82 y.o. male with a hx of hypertension, hyperlipidemia, current smoker x 60+ years, COPD who presents due to chest pain.  Patient had 1 episode of chest pain about 2 months ago while at home.  Denies any prior episodes, has not had any episodes since.  Compliant with medications as prescribed, very active at home.  Has occasional shortness of breath which he attributes to COPD and smoking.  Presented to the ED at the time, workup with troponins and EKG was unrevealing.  Past Medical History:  Diagnosis Date   COPD (chronic obstructive pulmonary disease) (HCC)    Hyperlipidemia    Hypertension    Knee pain     Past Surgical History:  Procedure Laterality Date   NO PAST SURGERIES      Current Medications: Current Meds  Medication Sig   albuterol (VENTOLIN HFA) 108 (90 Base) MCG/ACT inhaler Inhale into the lungs.   amLODipine (NORVASC) 10 MG tablet TAKE ONE TABLET BY MOUTH ONCE DAILY   aspirin EC 81 MG tablet Take 81 mg by mouth daily. Swallow whole.   gabapentin (NEURONTIN) 300 MG capsule Take 300 mg by mouth daily.   lisinopril (ZESTRIL) 40 MG tablet Take 1 tablet (40 mg total) by mouth daily.   metFORMIN (GLUCOPHAGE-XR) 500 MG 24 hr tablet Take 2 tablets (1,000 mg total) by mouth 2 (two) times daily.   omeprazole (PRILOSEC) 20 MG capsule Take 1 capsule (20 mg total) by mouth daily before breakfast.   ondansetron (ZOFRAN-ODT) 4 MG disintegrating tablet Take 1 tablet (4 mg total) by  mouth every 8 (eight) hours as needed for nausea or vomiting.   ONETOUCH ULTRA test strip USE TO CHECK BLOOD SUGAR TWICE DAILY   simvastatin (ZOCOR) 20 MG tablet TAKE ONE TABLET BY MOUTH AT BEDTIME   SYMBICORT 80-4.5 MCG/ACT inhaler Inhale 2 puffs into the lungs 2 (two) times daily.     Allergies:   Codeine   Social History   Socioeconomic History   Marital status: Married    Spouse name: Stephen Savage   Number of children: 2   Years of education: Not on file   Highest education level: Not on file  Occupational History   Occupation: retired  Tobacco Use   Smoking status: Every Day    Current packs/day: 1.00    Average packs/day: 1 pack/day for 60.0 years (60.0 ttl pk-yrs)    Types: Cigarettes   Smokeless tobacco: Current    Types: Chew  Vaping Use   Vaping status: Former  Substance and Sexual Activity   Alcohol use: Not Currently   Drug use: Never   Sexual activity: Not on file  Other Topics Concern   Not on file  Social History Narrative   Not on file   Social Determinants of Health   Financial Resource Strain: Low Risk  (04/10/2020)   Overall Financial Resource Strain (CARDIA)    Difficulty of Paying Living Expenses: Not  hard at all  Food Insecurity: No Food Insecurity (07/22/2022)   Hunger Vital Sign    Worried About Running Out of Food in the Last Year: Never true    Ran Out of Food in the Last Year: Never true  Transportation Needs: No Transportation Needs (04/10/2020)   PRAPARE - Administrator, Civil Service (Medical): No    Lack of Transportation (Non-Medical): No  Physical Activity: Inactive (04/10/2020)   Exercise Vital Sign    Days of Exercise per Week: 0 days    Minutes of Exercise per Session: 0 min  Stress: No Stress Concern Present (04/10/2020)   [employer] of Occupational Health - Occupational Stress Questionnaire    Feeling of Stress : Not at all  Social Connections: Not on file     Family History: The patient's family history  includes Diabetes (age of onset: 17) in his father; Heart attack in his mother; Heart disease (age of onset: 85) in his mother.  ROS:   Please see the history of present illness.     All other systems reviewed and are negative.  EKGs/Labs/Other Studies Reviewed:    The following studies were reviewed today:  EKG Interpretation Date/Time:  Thursday April 30 2023 12:01:03 EDT Ventricular Rate:  69 PR Interval:  174 QRS Duration:  88 QT Interval:  422 QTC Calculation: 452 R Axis:   66  Text Interpretation: Normal sinus rhythm Normal ECG Confirmed by Stephen Savage ([id number]) on 04/30/2023 12:04:43 PM    Recent Labs: 07/08/2022: ALT 9 02/23/2023: BUN 13; Creatinine, Ser 0.89; Hemoglobin 12.3; Platelets 351; Potassium 3.9; Sodium 134  Recent Lipid Panel    Component Value Date/Time   CHOL 122 07/08/2022 0812   TRIG 64 07/08/2022 0812   HDL 56 07/08/2022 0812   CHOLHDL 2.2 07/08/2022 0812   LDLCALC 52 07/08/2022 0812     Risk Assessment/Calculations:      Physical Exam:    VS:  BP (!) 144/64 (BP Location: Left Arm, Patient Position: Sitting, Cuff Size: Normal)   Pulse 69   Ht 5\' 9"  (1.753 m)   Wt 125 lb 12.8 oz (57.1 kg)   SpO2 95%   BMI 18.58 kg/m     Wt Readings from Last 3 Encounters:  04/30/23 125 lb 12.8 oz (57.1 kg)  12/26/22 137 lb 2 oz (62.2 kg)  11/14/22 137 lb 3.2 oz (62.2 kg)     GEN:  Well nourished, well developed in no acute distress HEENT: Normal NECK: No JVD; No carotid bruits CARDIAC: RRR, no murmurs, rubs, gallops RESPIRATORY:  Clear to auscultation without rales, wheezing or rhonchi  ABDOMEN: Soft, non-tender, non-distended MUSCULOSKELETAL:  No edema; No deformity  SKIN: Warm and dry NEUROLOGIC:  Alert and oriented x 3 PSYCHIATRIC:  Normal affect   ASSESSMENT:    1. Precordial pain   2. Primary hypertension   3. Pure hypercholesterolemia   4. Smoking    PLAN:    In order of problems listed above:  Chest pain, occurred once.   Has not happened since over the past 2 months.  Obtain echocardiogram.  Consider additional testing if there is recurrent chest pain or echo shows wall motion abnormalities. Hypertension, BP controlled, continue Norvasc, lisinopril. Hyperlipidemia, cholesterol controlled.  Continue simvastatin 20 mg daily. Current smoker, smoking cessation advised.  Follow-up after echocardiogram.      Medication Adjustments/Labs and Tests Ordered: Current medicines are reviewed at length with the patient today.  Concerns regarding medicines are outlined above.  Orders Placed This Encounter  Procedures   EKG 12-Lead   ECHOCARDIOGRAM COMPLETE   No orders of the defined types were placed in this encounter.   Patient Instructions  Medication Instructions:   Your physician recommends that you continue on your current medications as directed. Please refer to the Current Medication list given to you today.  *If you need a refill on your cardiac medications before your next appointment, please call your pharmacy*   Lab Work:  None Ordered  If you have labs (blood work) drawn today and your tests are completely normal, you will receive your results only by: MyChart Message (if you have MyChart) OR A paper copy in the mail If you have any lab test that is abnormal or we need to change your treatment, we will call you to review the results.   Testing/Procedures:  Your physician has requested that you have an echocardiogram. Echocardiography is a painless test that uses sound waves to create images of your heart. It provides your doctor with information about the size and shape of your heart and how well your heart's chambers and valves are working. This procedure takes approximately one hour. There are no restrictions for this procedure. Please do NOT wear cologne, perfume, aftershave, or lotions (deodorant is allowed). Please arrive 15 minutes prior to your appointment time.    Follow-Up: At [facility], you and your health needs are our priority.  As part of our continuing mission to provide you with exceptional heart care, we have created designated Provider Care Teams.  These Care Teams include your primary Cardiologist (physician) and Advanced Practice Providers (APPs -  Physician Assistants and Nurse Practitioners) who all work together to provide you with the care you need, when you need it.  We recommend signing up for the patient portal called "MyChart".  Sign up information is provided on this After Visit Summary.  MyChart is used to connect with patients for Virtual Visits (Telemedicine).  Patients are able to view lab/test results, encounter notes, upcoming appointments, etc.  Non-urgent messages can be sent to your provider as well.   To learn more about what you can do with MyChart, go to [email or web address].    Your next appointment:    After Echocardiogram  Provider:   You may see Stephen Odea, MD or one of the following Advanced Practice Providers on your designated Care Team:   Nicolasa Ducking, NP Eula Listen, PA-C Cadence Fransico Michael, PA-C Charlsie Quest, NP   Signed, Stephen Odea, MD  04/30/2023 12:49 PM    [facility]

## 2023-04-30 NOTE — Telephone Encounter (Signed)
Left a message to schedule the echo and follow up. Missed the discharge on the way out

## 2023-05-12 NOTE — Telephone Encounter (Signed)
Called and spoke to daughter and she said , she will call for these appointment

## 2023-05-16 ENCOUNTER — Other Ambulatory Visit: Payer: Self-pay | Admitting: Family Medicine

## 2023-05-16 DIAGNOSIS — E1169 Type 2 diabetes mellitus with other specified complication: Secondary | ICD-10-CM

## 2023-05-18 ENCOUNTER — Encounter: Payer: Self-pay | Admitting: Family Medicine

## 2023-05-18 ENCOUNTER — Other Ambulatory Visit: Payer: Self-pay | Admitting: Family Medicine

## 2023-05-18 ENCOUNTER — Ambulatory Visit (INDEPENDENT_AMBULATORY_CARE_PROVIDER_SITE_OTHER): Payer: Medicare Other | Admitting: Family Medicine

## 2023-05-18 VITALS — BP 136/76 | HR 81 | Ht 69.0 in | Wt 126.0 lb

## 2023-05-18 DIAGNOSIS — F5104 Psychophysiologic insomnia: Secondary | ICD-10-CM | POA: Diagnosis not present

## 2023-05-18 DIAGNOSIS — E785 Hyperlipidemia, unspecified: Secondary | ICD-10-CM

## 2023-05-18 DIAGNOSIS — J432 Centrilobular emphysema: Secondary | ICD-10-CM

## 2023-05-18 DIAGNOSIS — E1169 Type 2 diabetes mellitus with other specified complication: Secondary | ICD-10-CM | POA: Diagnosis not present

## 2023-05-18 DIAGNOSIS — I10 Essential (primary) hypertension: Secondary | ICD-10-CM | POA: Diagnosis not present

## 2023-05-18 DIAGNOSIS — Z23 Encounter for immunization: Secondary | ICD-10-CM | POA: Diagnosis not present

## 2023-05-18 DIAGNOSIS — Z0000 Encounter for general adult medical examination without abnormal findings: Secondary | ICD-10-CM

## 2023-05-18 DIAGNOSIS — N400 Enlarged prostate without lower urinary tract symptoms: Secondary | ICD-10-CM

## 2023-05-18 LAB — POCT GLYCOSYLATED HEMOGLOBIN (HGB A1C): Hemoglobin A1C: 7.1 % — AB (ref 4.0–5.6)

## 2023-05-18 MED ORDER — ONETOUCH ULTRA VI STRP: ORAL_STRIP | 5 refills | Status: DC

## 2023-05-18 MED ORDER — TRAZODONE HCL 50 MG PO TABS
50.0000 mg | ORAL_TABLET | Freq: Every evening | ORAL | 3 refills | Status: DC | PRN
Start: 2023-05-18 — End: 2024-05-04

## 2023-05-18 MED ORDER — AMLODIPINE BESYLATE 10 MG PO TABS: 10.0000 mg | ORAL_TABLET | Freq: Every day | ORAL | 3 refills | Status: DC

## 2023-05-18 MED ORDER — SIMVASTATIN 20 MG PO TABS
20.0000 mg | ORAL_TABLET | Freq: Every day | ORAL | 3 refills | Status: DC
Start: 2023-05-18 — End: 2024-05-30

## 2023-05-18 NOTE — Progress Notes (Signed)
Subjective:    Patient ID: Stephen Savage, male    DOB: 1941/03/18, 82 y.o.   MRN: [id number]  Stephen Savage is a 82 y.o. male presenting on 05/18/2023 for Diabetes   HPI  Discussed the use of AI scribe software for clinical note transcription with the patient, who gave verbal consent to proceed.  History of Present Illness     Centrilobular Emphysema Tobacco Abuse Chronic problem, active smoker 1ppd >60 years. Not ready to quit. He has some occasional breathing problem dyspnea or cough. On Symbicort some AS NEEDED use He has tried Clinical cytogeneticist but cost was too high He has reduced some smoking Requests Sample Breztri again   CHRONIC HTN: Reports no concerns, has controlled BP Current Meds - Quinapril 40mg  daily, Amlodipine 10mg  daily,    Reports good compliance, took meds today. Tolerating well, w/o complaints. Denies CP, dyspnea, HA, edema, dizziness / lightheadedness   CHRONIC DM, Type 2: A1c down to 7.1 CBGs: checking 1-2 times per day Meds: Metformin XR 500mg  x 2 = 1000mg  TWICE A DAY - Unable to get medication approved due to medicare donut hole Januvia, Rybelsus previously Reports good compliance. Tolerating well w/o side-effects Currently on ACEi Controlled on Statin Lifestyle: - Diet (Improved)  - Exercise (Active) DM Eye Exam [facility] due this year 2024 Denies hypoglycemia, polyuria, visual changes, numbness or tingling.   Bilateral Knee Pain chronic, Osteoarthritis Back Pain, lumbar disc Followed by [facility] Taking Tylenol  Upcoming MRI in November for kidney lesion   Insomnia Request rx Trazodone 50mg , has used before. It has helped.  Health Maintenance: Flu Shot today  Future shingles vaccine at the pharmacy     11/14/2022   10:19 AM 07/16/2022    9:35 AM 06/18/2021    1:59 PM  Depression screen PHQ 2/9  Decreased Interest 0 0 0  Down, Depressed, Hopeless 0 0 0  PHQ - 2 Score 0 0 0  Altered sleeping 0    Tired, decreased  energy 1    Change in appetite 0    Feeling bad or failure about yourself  0    Trouble concentrating 0    Moving slowly or fidgety/restless 0    Suicidal thoughts 0    PHQ-9 Score 1    Difficult doing work/chores Not difficult at all      Social History   Tobacco Use   Smoking status: Every Day    Current packs/day: 1.00    Average packs/day: 1 pack/day for 60.0 years (60.0 ttl pk-yrs)    Types: Cigarettes   Smokeless tobacco: Current    Types: Chew  Vaping Use   Vaping status: Former  Substance Use Topics   Alcohol use: Not Currently   Drug use: Never    Review of Systems Per HPI unless specifically indicated above     Objective:    BP 136/76   Pulse 81   Ht 5\' 9"  (1.753 m)   Wt 126 lb (57.2 kg)   SpO2 96%   BMI 18.61 kg/m   Wt Readings from Last 3 Encounters:  05/18/23 126 lb (57.2 kg)  04/30/23 125 lb 12.8 oz (57.1 kg)  12/26/22 137 lb 2 oz (62.2 kg)    Physical Exam Vitals and nursing note reviewed.  Constitutional:      General: He is not in acute distress.    Appearance: Normal appearance. He is well-developed. He is not diaphoretic.     Comments: Well-appearing, comfortable, cooperative  HENT:     Head: Normocephalic and atraumatic.  Eyes:     General:        Right eye: No discharge.        Left eye: No discharge.     Conjunctiva/sclera: Conjunctivae normal.  Neck:     Thyroid: No thyromegaly.  Cardiovascular:     Rate and Rhythm: Normal rate and regular rhythm.     Pulses: Normal pulses.     Heart sounds: Normal heart sounds. No murmur heard. Pulmonary:     Effort: Pulmonary effort is normal. No respiratory distress.     Breath sounds: Wheezing present. No rales.     Comments: Reduced air movement at baseline Musculoskeletal:        General: Normal range of motion.     Cervical back: Normal range of motion and neck supple.  Lymphadenopathy:     Cervical: No cervical adenopathy.  Skin:    General: Skin is warm and dry.     Findings: No  erythema or rash.  Neurological:     Mental Status: He is alert and oriented to person, place, and time. Mental status is at baseline.  Psychiatric:        Mood and Affect: Mood normal.        Behavior: Behavior normal.        Thought Content: Thought content normal.     Comments: Well groomed, good eye contact, normal speech and thoughts      Results for orders placed or performed in visit on 05/18/23  POCT glycosylated hemoglobin (Hb A1C)  Result Value Ref Range   Hemoglobin A1C 7.1 (A) 4.0 - 5.6 %   HbA1c POC (<> result, manual entry)     HbA1c, POC (prediabetic range)     HbA1c, POC (controlled diabetic range)        Assessment & Plan:   Problem List Items Addressed This Visit     Essential hypertension   Relevant Medications   amLODipine (NORVASC) 10 MG tablet   simvastatin (ZOCOR) 20 MG tablet   Hyperlipidemia associated with type 2 diabetes mellitus (HCC)   Relevant Medications   amLODipine (NORVASC) 10 MG tablet   simvastatin (ZOCOR) 20 MG tablet   Type 2 diabetes mellitus with other specified complication (HCC) - Primary   Relevant Medications   simvastatin (ZOCOR) 20 MG tablet   glucose blood (ONETOUCH ULTRA) test strip   Other Relevant Orders   POCT glycosylated hemoglobin (Hb A1C) (Completed)   Other Visit Diagnoses     Need for influenza vaccination       Relevant Orders   Flu Vaccine Trivalent High Dose (Fluad)   Psychophysiological insomnia       Relevant Medications   traZODone (DESYREL) 50 MG tablet       Assessment and Plan    Type 2 Diabetes Mellitus Improved control with A1C down from 8.2 to 7.1. Patient reports inconsistent glucose monitoring. -Continue current regimen. -Refill metformin and glucose test strips. -Encourage consistent glucose monitoring.  Hypertension Well controlled with BP 136/76. -Continue current regimen. -Refill medications  Emphysema / Chronic Obstructive Pulmonary Disease (COPD) Reports increased shortness of  breath with exertion. Using Symbicort and sample inhaler intermittently. -Continue current inhaler regimen. -Provide Breztri inhaler sample x 1 -Encourage consistent use of inhalers.  Insomnia Reports difficulty falling asleep and early morning awakenings. Has been taking half of a 100mg  Trazodone tablet. -Start Trazodone 50mg  nightly as needed for insomnia.  Renal Mass Undergoing surveillance with MRI  scheduled in November.  General Health Maintenance -Administer influenza vaccine today. -Encourage patient to receive shingles vaccine at pharmacy.      Orders Placed This Encounter  Procedures   Flu Vaccine Trivalent High Dose (Fluad)   POCT glycosylated hemoglobin (Hb A1C)     Meds ordered this encounter  Medications   amLODipine (NORVASC) 10 MG tablet    Sig: Take 1 tablet (10 mg total) by mouth daily.    Dispense:  90 tablet    Refill:  3   simvastatin (ZOCOR) 20 MG tablet    Sig: Take 1 tablet (20 mg total) by mouth at bedtime.    Dispense:  90 tablet    Refill:  3   glucose blood (ONETOUCH ULTRA) test strip    Sig: Use to check blood sugar twice daily    Dispense:  100 strip    Refill:  5   traZODone (DESYREL) 50 MG tablet    Sig: Take 1 tablet (50 mg total) by mouth at bedtime as needed for sleep.    Dispense:  30 tablet    Refill:  3      Follow up plan: Return in about 6 months (around 11/15/2023) for 6 month fasting lab only then 1 week later Annual Physical.  Future labs ordered for 11/16/23   [name], DO [facility] 05/18/2023, 9:48 AM

## 2023-05-18 NOTE — Patient Instructions (Addendum)
Thank you for coming to the office today.  Sample Breztri inhaler 28 puffs total, can use 2 puff twice a day if you need Or substitute for Symbicort.  New medicine Trazodone 50mg  nightly for insomnia as needed.  Shingrix vaccine at the pharmacy 2 doses  Refilled Test strips as requested.  Recent Labs    07/08/22 0812 11/14/22 1005 05/18/23 0955  HGBA1C 7.8* 8.2* 7.1*   Great job improved A1c result.  Please schedule a Follow-up Appointment to: Return in about 6 months (around 11/15/2023) for 6 month fasting lab only then 1 week later Annual Physical.  If you have any other questions or concerns, please feel free to call the office or send a message through MyChart. You may also schedule an earlier appointment if necessary.  Additionally, you may be receiving a survey about your experience at our office within a few days to 1 week by e-mail or mail. We value your feedback.  [name], DO [facility], [place]

## 2023-07-06 ENCOUNTER — Ambulatory Visit
Admission: RE | Admit: 2023-07-06 | Discharge: 2023-07-06 | Disposition: A | Discharge status: Home / Self Care | Payer: Medicare Other | Source: Ambulatory Visit | Attending: Urology | Admitting: Urology

## 2023-07-06 DIAGNOSIS — N2889 Other specified disorders of kidney and ureter: Secondary | ICD-10-CM

## 2023-07-06 DIAGNOSIS — N281 Cyst of kidney, acquired: Secondary | ICD-10-CM | POA: Diagnosis not present

## 2023-07-06 DIAGNOSIS — K429 Umbilical hernia without obstruction or gangrene: Secondary | ICD-10-CM | POA: Diagnosis not present

## 2023-07-06 DIAGNOSIS — D3501 Benign neoplasm of right adrenal gland: Secondary | ICD-10-CM | POA: Diagnosis not present

## 2023-07-06 MED ORDER — GADOBUTROL 1 MMOL/ML IV SOLN
5.0000 mL | Freq: Once | INTRAVENOUS | Status: AC | PRN
  Administered 2023-07-06: 5 mL via INTRAVENOUS

## 2023-08-26 ENCOUNTER — Ambulatory Visit: Payer: Medicare Other | Admitting: Urology

## 2023-08-26 VITALS — BP 179/77 | HR 103 | Ht 69.0 in | Wt 129.0 lb

## 2023-08-26 DIAGNOSIS — N2889 Other specified disorders of kidney and ureter: Secondary | ICD-10-CM

## 2023-08-26 NOTE — Progress Notes (Signed)
 Stephen Savage Savage Plume,acting as a scribe for Stephen Riis, MD.,have documented all relevant documentation on the behalf of Stephen Riis, MD,as directed by  Stephen Riis, MD while in the presence of Stephen Riis, MD.  08/26/23 9:44 AM   Stephen Stephen Prudent [birth year] [id number]  Referring provider: [name], DO [address] [place],  [place] [address]  Chief Complaint  Patient presents with   Follow-up    HPI: 83 year-old male who presents today for follow up for renal mass.   He was incidentally identified in 2023 with a right renal mass measuring 2.9 cm, diffusely enhancing in the right upper pole. He elected conservative management. A follow up MRI 6 months later showed no interval change. He follows up today, 1 year later, with another MRI. I personally reviewed that today. It shows very slight enlargement, approximately 1-2 mm growth, but otherwise insignificant.   He does have multiple medical comorbidities, including tobacco abuse, emphysema, diabetes, etc.    PMH: Past Medical History:  Diagnosis Date   COPD (chronic obstructive pulmonary disease) (HCC)    Hyperlipidemia    Hypertension    Knee pain     Surgical History: Past Surgical History:  Procedure Laterality Date   NO PAST SURGERIES      Home Medications:  Allergies as of 08/26/2023       Reactions   Codeine Nausea Only, Nausea And Vomiting        Medication List        Accurate as of August 26, 2023  9:44 AM. If you have any questions, ask your nurse or doctor.          amLODipine  10 MG tablet Commonly known as: NORVASC  Take 1 tablet (10 mg total) by mouth daily.   aspirin EC 81 MG tablet Take 81 mg by mouth daily. Swallow whole.   lisinopril  40 MG tablet Commonly known as: ZESTRIL  Take 1 tablet (40 mg total) by mouth daily.   metFORMIN  500 MG 24 hr tablet Commonly known as: GLUCOPHAGE -XR Take 2 tablets (1,000 mg total) by mouth 2 (two) times daily.   omeprazole  20 MG  capsule Commonly known as: PRILOSEC Take 1 capsule (20 mg total) by mouth daily before breakfast.   ondansetron  4 MG disintegrating tablet Commonly known as: ZOFRAN -ODT Take 1 tablet (4 mg total) by mouth every 8 (eight) hours as needed for nausea or vomiting.   OneTouch Ultra test strip Generic drug: glucose blood Use to check blood sugar twice daily   simvastatin  20 MG tablet Commonly known as: ZOCOR  Take 1 tablet (20 mg total) by mouth at bedtime.   Symbicort  80-4.5 MCG/ACT inhaler Generic drug: budesonide-formoterol Inhale 2 puffs into the lungs 2 (two) times daily.   traZODone  50 MG tablet Commonly known as: DESYREL  Take 1 tablet (50 mg total) by mouth at bedtime as needed for sleep.   Ventolin  HFA 108 (90 Base) MCG/ACT inhaler Generic drug: albuterol  Inhale into the lungs.        Allergies:  Allergies  Allergen Reactions   Codeine Nausea Only and Nausea And Vomiting    Family History: Family History  Problem Relation Age of Onset   Heart disease Mother 55   Heart attack Mother    Diabetes Father 83    Social History:  reports that he has been smoking cigarettes. He has a 60 pack-year smoking history. His smokeless tobacco use includes chew. He reports that he does not currently use alcohol. He reports that  he does not use drugs.   Physical Exam: BP (!) 179/77   Pulse (!) 103   Ht 5' 9 (1.753 m)   Wt 129 lb (58.5 kg)   BMI 19.05 kg/m   Constitutional:  Alert and oriented, No acute distress. HEENT: [place] AT, moist mucus membranes.  Trachea midline, no masses. Neurologic: Grossly intact, no focal deficits, moving all 4 extremities. Psychiatric: Normal mood and affect.  Pertinent Imaging: EXAM: MRI ABDOMEN WITHOUT AND WITH CONTRAST   TECHNIQUE: Multiplanar multisequence MR imaging of the abdomen was performed both before and after the administration of intravenous contrast.   CONTRAST:  5mL GADAVIST  GADOBUTROL  1 MMOL/ML IV SOLN   COMPARISON:   08/29/2022   FINDINGS: Lower chest: No acute abnormality.   Hepatobiliary: No solid liver abnormality is seen. No gallstones, gallbladder wall thickening, or biliary dilatation.   Pancreas: Unremarkable. No pancreatic ductal dilatation or surrounding inflammatory changes.   Spleen: Normal in size without significant abnormality.   Adrenals/Urinary Tract: Definitively benign, macroscopic fat containing right adrenal adenoma, for which no further follow-up or characterization is required. Slight interval enlargement of a vividly enhancing mass of the posterior superior pole of the right kidney measuring 3.0 x 2.9 cm, previously 2.7 x 2.7 cm when measured similarly (series 4, image 11). Additional simple, benign cortical cyst of the inferior pole of the right kidney, for which no specific further follow-up or characterization is required. The left kidney is normal, without renal calculi, solid lesion, or hydronephrosis.   Stomach/Bowel: Stomach is within normal limits. No evidence of bowel wall thickening, distention, or inflammatory changes.   Vascular/Lymphatic: Aortic atherosclerosis. No enlarged abdominal lymph nodes.   Other: Small fat containing umbilical hernia.  No ascites.   Musculoskeletal: No acute or significant osseous findings.   IMPRESSION: 1. Slight interval enlargement of an enhancing mass of the posterior superior pole of the right kidney measuring 3.0 x 2.9 cm, previously 2.7 x 2.7 cm when measured similarly. This remains consistent with a slowly enlarging renal cell carcinoma. 2. No evidence of renal vein invasion, lymphadenopathy, or metastatic disease in the abdomen.   Aortic Atherosclerosis (ICD10-I70.0).     Electronically Signed   By: Marolyn JONETTA Jaksch M.D.   On: 07/09/2023 21:39  This was personally reviewed and I agree with the radiologic interpretation.    Assessment & Plan:    1. Right renal mass - Highly suspicious for fairly indolent renal  cell carcinoma, growth rate < 3 mm, which is reassuring.  - Strongly recommend continued surveillance given his advanced age and comorbidities.  - Discussed the low risk of progression and the potential risks of surgical intervention, which are not recommended at this time.  Return in about 1 year (around 08/25/2024) for reasessment with repeat MRI.  I have reviewed the above documentation for accuracy and completeness, and I agree with the above.   Stephen Riis, MD    [facility] [address], [address] [place], [place] [address] [phone]

## 2023-11-15 ENCOUNTER — Other Ambulatory Visit: Payer: Self-pay | Admitting: Family Medicine

## 2023-11-15 DIAGNOSIS — E1169 Type 2 diabetes mellitus with other specified complication: Secondary | ICD-10-CM

## 2023-11-16 ENCOUNTER — Other Ambulatory Visit: Payer: Self-pay

## 2023-11-16 DIAGNOSIS — J432 Centrilobular emphysema: Secondary | ICD-10-CM

## 2023-11-16 DIAGNOSIS — I10 Essential (primary) hypertension: Secondary | ICD-10-CM | POA: Diagnosis not present

## 2023-11-16 DIAGNOSIS — E785 Hyperlipidemia, unspecified: Secondary | ICD-10-CM | POA: Diagnosis not present

## 2023-11-16 DIAGNOSIS — N400 Enlarged prostate without lower urinary tract symptoms: Secondary | ICD-10-CM

## 2023-11-16 DIAGNOSIS — E1169 Type 2 diabetes mellitus with other specified complication: Secondary | ICD-10-CM

## 2023-11-16 DIAGNOSIS — Z0000 Encounter for general adult medical examination without abnormal findings: Secondary | ICD-10-CM

## 2023-11-17 LAB — LIPID PANEL
Cholesterol: 128 mg/dL (ref <200)
HDL: 50 mg/dL (ref >=40)
LDL Cholesterol (Calc): 55 mg/dL
Non-HDL Cholesterol (Calc): 78 mg/dL (ref <130)
Total CHOL/HDL Ratio: 2.6 (calc) (ref <5.0)
Triglycerides: 152 mg/dL — ABNORMAL HIGH (ref <150)

## 2023-11-17 LAB — CBC WITH DIFFERENTIAL/PLATELET
Absolute Lymphocytes: 2061 {cells}/uL (ref 850–3900)
Absolute Monocytes: 589 {cells}/uL (ref 200–950)
Basophils Absolute: 37 {cells}/uL (ref 0–200)
Basophils Relative: 0.4 %
Eosinophils Absolute: 221 {cells}/uL (ref 15–500)
Eosinophils Relative: 2.4 %
HCT: 39.2 % (ref 38.5–50.0)
Hemoglobin: 12.8 g/dL — ABNORMAL LOW (ref 13.2–17.1)
MCH: 29.2 pg (ref 27.0–33.0)
MCHC: 32.7 g/dL (ref 32.0–36.0)
MCV: 89.3 fL (ref 80.0–100.0)
MPV: 10.1 fL (ref 7.5–12.5)
Monocytes Relative: 6.4 %
Neutro Abs: 6293 {cells}/uL (ref 1500–7800)
Neutrophils Relative %: 68.4 %
Platelets: 314 10*3/uL (ref 140–400)
RBC: 4.39 10*6/uL (ref 4.20–5.80)
RDW: 12.6 % (ref 11.0–15.0)
Total Lymphocyte: 22.4 %
WBC: 9.2 10*3/uL (ref 3.8–10.8)

## 2023-11-17 LAB — MICROALBUMIN / CREATININE URINE RATIO
Creatinine, Urine: 65 mg/dL (ref 20–320)
Microalb Creat Ratio: 20 mg/g{creat} (ref <30)
Microalb, Ur: 1.3 mg/dL

## 2023-11-17 LAB — COMPLETE METABOLIC PANEL WITHOUT GFR
AG Ratio: 1.6 (calc) (ref 1.0–2.5)
ALT: 10 U/L (ref 9–46)
AST: 10 U/L (ref 10–35)
Albumin: 3.9 g/dL (ref 3.6–5.1)
Alkaline phosphatase (APISO): 56 U/L (ref 35–144)
BUN: 15 mg/dL (ref 7–25)
CO2: 28 mmol/L (ref 20–32)
Calcium: 9.3 mg/dL (ref 8.6–10.3)
Chloride: 97 mmol/L — ABNORMAL LOW (ref 98–110)
Creat: 0.94 mg/dL (ref 0.70–1.22)
Globulin: 2.5 g/dL (ref 1.9–3.7)
Glucose, Bld: 181 mg/dL — ABNORMAL HIGH (ref 65–99)
Potassium: 4.2 mmol/L (ref 3.5–5.3)
Sodium: 136 mmol/L (ref 135–146)
Total Bilirubin: 0.8 mg/dL (ref 0.2–1.2)
Total Protein: 6.4 g/dL (ref 6.1–8.1)

## 2023-11-17 LAB — HEMOGLOBIN A1C
Hgb A1c MFr Bld: 8.6 %{Hb} — ABNORMAL HIGH (ref <5.7)
Mean Plasma Glucose: 200 mg/dL
eAG (mmol/L): 11.1 mmol/L

## 2023-11-17 LAB — PSA: PSA: 0.16 ng/mL (ref <=4.00)

## 2023-11-17 NOTE — Telephone Encounter (Signed)
 Requested Prescriptions  Pending Prescriptions Disp Refills   metFORMIN (GLUCOPHAGE-XR) 500 MG 24 hr tablet [Pharmacy Med Name: METFORMIN ER 500MG  24HR TABS] 360 tablet 0    Sig: TAKE 2 TABLETS(1000 MG) BY MOUTH TWICE DAILY     Endocrinology:  Diabetes - Biguanides Failed - 11/17/2023 12:37 PM      Failed - HBA1C is between 0 and 7.9 and within 180 days    Hemoglobin A1C  Date Value Ref Range Status  09/29/2019 6.9  Final    Comment:    [facility]   Hgb A1c MFr Bld  Date Value Ref Range Status  11/16/2023 8.6 (H) <5.7 % of total Hgb Final    Comment:    For someone without known diabetes, a hemoglobin A1c value of 6.5% or greater indicates that they may have  diabetes and this should be confirmed with a follow-up  test. . For someone with known diabetes, a value <7% indicates  that their diabetes is well controlled and a value  greater than or equal to 7% indicates suboptimal  control. A1c targets should be individualized based on  duration of diabetes, age, comorbid conditions, and  other considerations. . Currently, no consensus exists regarding use of hemoglobin A1c for diagnosis of diabetes for children. .          Failed - B12 Level in normal range and within 720 days    No results found for: "VITAMINB12"       Failed - Valid encounter within last 6 months    Recent Outpatient Visits   None     Future Appointments             In 1 week Karamalegos, Netta Neat, DO [facility]   In 9 months Vanna Scotland, MD [facility] [place]            Failed - CBC within normal limits and completed in the last 12 months    WBC  Date Value Ref Range Status  11/16/2023 9.2 3.8 - 10.8 Thousand/uL Final   RBC  Date Value Ref Range Status  11/16/2023 4.39 4.20 - 5.80 Million/uL Final   Hemoglobin  Date Value Ref Range Status  11/16/2023 12.8 (L) 13.2 - 17.1 g/dL Final   HCT  Date Value Ref Range Status   11/16/2023 39.2 38.5 - 50.0 % Final   MCHC  Date Value Ref Range Status  11/16/2023 32.7 32.0 - 36.0 g/dL Final    Comment:    For adults, a slight decrease in the calculated MCHC value (in the range of 30 to 32 g/dL) is most likely not clinically significant; however, it should be interpreted with caution in correlation with other red cell parameters and the patient's clinical condition.    [facility]  Date Value Ref Range Status  11/16/2023 29.2 27.0 - 33.0 pg Final   MCV  Date Value Ref Range Status  11/16/2023 89.3 80.0 - 100.0 fL Final   No results found for: "PLTCOUNTKUC", "LABPLAT", "POCPLA" RDW  Date Value Ref Range Status  11/16/2023 12.6 11.0 - 15.0 % Final         Passed - Cr in normal range and within 360 days    Creat  Date Value Ref Range Status  11/16/2023 0.94 0.70 - 1.22 mg/dL Final   Creatinine, Urine  Date Value Ref Range Status  11/16/2023 65 20 - 320 mg/dL Final         Passed -  eGFR in normal range and within 360 days    GFR, Est African American  Date Value Ref Range Status  04/05/2020 93 > OR = 60 mL/min/1.60m2 Final   GFR, Est Non African American  Date Value Ref Range Status  04/05/2020 80 > OR = 60 mL/min/1.62m2 Final   GFR, Estimated  Date Value Ref Range Status  02/23/2023 >60 >60 mL/min Final    Comment:    (NOTE) Calculated using the CKD-EPI Creatinine Equation (2021)    eGFR  Date Value Ref Range Status  06/20/2021 86 > OR = 60 mL/min/1.78m2 Final    Comment:    The eGFR is based on the CKD-EPI 2021 equation. To calculate  the new eGFR from a previous Creatinine or Cystatin C result, go to https://www.kidney.org/professionals/ kdoqi/gfr%5Fcalculator

## 2023-11-18 ENCOUNTER — Other Ambulatory Visit: Payer: Self-pay | Admitting: Family Medicine

## 2023-11-18 DIAGNOSIS — I10 Essential (primary) hypertension: Secondary | ICD-10-CM

## 2023-11-18 DIAGNOSIS — K219 Gastro-esophageal reflux disease without esophagitis: Secondary | ICD-10-CM

## 2023-11-19 NOTE — Telephone Encounter (Signed)
 Requested Prescriptions  Pending Prescriptions Disp Refills   omeprazole (PRILOSEC) 20 MG capsule [Pharmacy Med Name: OMEPRAZOLE 20MG  CAPSULES] 90 capsule 0    Sig: TAKE 1 CAPSULE(20 MG) BY MOUTH DAILY BEFORE BREAKFAST     Gastroenterology: Proton Pump Inhibitors Failed - 11/19/2023  2:21 PM      Failed - Valid encounter within last 12 months    Recent Outpatient Visits   None     Future Appointments             In 9 months Vanna Scotland, MD [facility] [place]             lisinopril (ZESTRIL) 40 MG tablet [Pharmacy Med Name: LISINOPRIL 40MG  TABLETS] 90 tablet 0    Sig: TAKE 1 TABLET(40 MG) BY MOUTH DAILY     Cardiovascular:  ACE Inhibitors Failed - 11/19/2023  2:21 PM      Failed - Last BP in normal range    BP Readings from Last 1 Encounters:  08/26/23 (!) 179/77         Failed - Valid encounter within last 6 months    Recent Outpatient Visits   None     Future Appointments             In 9 months Vanna Scotland, MD [facility] [place]            Passed - Cr in normal range and within 180 days    Creat  Date Value Ref Range Status  11/16/2023 0.94 0.70 - 1.22 mg/dL Final   Creatinine, Urine  Date Value Ref Range Status  11/16/2023 65 20 - 320 mg/dL Final         Passed - K in normal range and within 180 days    Potassium  Date Value Ref Range Status  11/16/2023 4.2 3.5 - 5.3 mmol/L Final         Passed - Patient is not pregnant

## 2023-11-24 ENCOUNTER — Encounter: Payer: Self-pay | Admitting: Family Medicine

## 2023-11-26 ENCOUNTER — Other Ambulatory Visit: Payer: Self-pay | Admitting: Family Medicine

## 2023-11-26 ENCOUNTER — Encounter: Payer: Self-pay | Admitting: Family Medicine

## 2023-11-26 ENCOUNTER — Ambulatory Visit (INDEPENDENT_AMBULATORY_CARE_PROVIDER_SITE_OTHER): Admitting: Family Medicine

## 2023-11-26 VITALS — BP 138/70 | HR 100 | Ht 69.0 in | Wt 130.0 lb

## 2023-11-26 DIAGNOSIS — I10 Essential (primary) hypertension: Secondary | ICD-10-CM | POA: Diagnosis not present

## 2023-11-26 DIAGNOSIS — Z7984 Long term (current) use of oral hypoglycemic drugs: Secondary | ICD-10-CM

## 2023-11-26 DIAGNOSIS — E1169 Type 2 diabetes mellitus with other specified complication: Secondary | ICD-10-CM

## 2023-11-26 DIAGNOSIS — Z23 Encounter for immunization: Secondary | ICD-10-CM

## 2023-11-26 DIAGNOSIS — E785 Hyperlipidemia, unspecified: Secondary | ICD-10-CM | POA: Diagnosis not present

## 2023-11-26 DIAGNOSIS — J432 Centrilobular emphysema: Secondary | ICD-10-CM | POA: Diagnosis not present

## 2023-11-26 DIAGNOSIS — M170 Bilateral primary osteoarthritis of knee: Secondary | ICD-10-CM

## 2023-11-26 DIAGNOSIS — Z0000 Encounter for general adult medical examination without abnormal findings: Secondary | ICD-10-CM | POA: Diagnosis not present

## 2023-11-26 DIAGNOSIS — K219 Gastro-esophageal reflux disease without esophagitis: Secondary | ICD-10-CM | POA: Diagnosis not present

## 2023-11-26 DIAGNOSIS — N400 Enlarged prostate without lower urinary tract symptoms: Secondary | ICD-10-CM

## 2023-11-26 DIAGNOSIS — I2510 Atherosclerotic heart disease of native coronary artery without angina pectoris: Secondary | ICD-10-CM

## 2023-11-26 MED ORDER — LISINOPRIL 40 MG PO TABS: 40.0000 mg | ORAL_TABLET | Freq: Every day | ORAL | 3 refills | Status: AC

## 2023-11-26 MED ORDER — METFORMIN HCL ER 500 MG PO TB24: 1000.0000 mg | ORAL_TABLET | Freq: Two times a day (BID) | ORAL | 3 refills | Status: AC

## 2023-11-26 MED ORDER — OMEPRAZOLE 20 MG PO CPDR: 20.0000 mg | DELAYED_RELEASE_CAPSULE | Freq: Every day | ORAL | 3 refills | Status: AC

## 2023-11-26 NOTE — Progress Notes (Signed)
 Subjective:    Patient ID: Stephen Savage, male    DOB: [birth year], 83 y.o.   MRN: [id number]  Stephen Savage is a 83 y.o. male presenting on 11/26/2023 for Annual Exam   HPI  Discussed the use of AI scribe software for clinical note transcription with the patient, who gave verbal consent to proceed.  History of Present Illness   Stephen Savage is an 83 year old male with COPD who presents for an annual physical exam.      Centrilobular Emphysema Tobacco Abuse  Recent COPD flare 11/10/23 seen at Urgent Care - given Zpak antibiotic and Prednisone treatment Today improved  Chronic problem, active smoker 1ppd >60 years. Not ready to quit. He has some occasional breathing problem dyspnea or cough. On Symbicort 1 puff per day sometimes due to high cost, using it less. He will check with pharmacist on cost. He has tried [place] but cost was too high He has reduced some smoking   CHRONIC HTN: Reports no concerns, has controlled BP Current Meds - Quinapril 40mg  daily, Amlodipine 10mg  daily,    Reports good compliance, took meds today. Tolerating well, w/o complaints. Denies CP, dyspnea, HA, edema, dizziness / lightheadedness   CHRONIC DM, Type 2: A1c 8.6, elevation from 7.1 prior Unsure reason for elevation except did take Prednisone since last visit and following labs. CBGs: checking 1-2 times per day Meds: Metformin XR 500mg  x 2 = 1000mg  TWICE A DAY - Unable to get medication approved due to medicare donut hole Januvia, Rybelsus previously Reports good compliance. Tolerating well w/o side-effects Currently on ACEi Controlled on Statin Lifestyle: - Diet (Improved)  - Exercise (Active) DM Eye Exam [facility] due this year 2024 Denies hypoglycemia, polyuria, visual changes, numbness or tingling.   Bilateral Knee Pain chronic, Osteoarthritis Back Pain, lumbar disc Followed by [facility] Taking Tylenol    Insomnia Continue Trazodone 50mg    Health  Maintenance: Prevnar-20 vaccine today   Future shingles vaccine at the pharmacy     11/14/2022   10:19 AM 07/16/2022    9:35 AM 06/18/2021    1:59 PM  Depression screen PHQ 2/9  Decreased Interest 0 0 0  Down, Depressed, Hopeless 0 0 0  PHQ - 2 Score 0 0 0  Altered sleeping 0    Tired, decreased energy 1    Change in appetite 0    Feeling bad or failure about yourself  0    Trouble concentrating 0    Moving slowly or fidgety/restless 0    Suicidal thoughts 0    PHQ-9 Score 1    Difficult doing work/chores Not difficult at all         11/14/2022   10:19 AM  GAD 7 : Generalized Anxiety Score  Nervous, Anxious, on Edge 0  Control/stop worrying 0  Worry too much - different things 0  Trouble relaxing 0  Restless 0  Easily annoyed or irritable 0  Afraid - awful might happen 0  Total GAD 7 Score 0  Anxiety Difficulty Not difficult at all     Past Medical History:  Diagnosis Date   COPD (chronic obstructive pulmonary disease) (HCC)    Hyperlipidemia    Hypertension    Knee pain    Past Surgical History:  Procedure Laterality Date   NO PAST SURGERIES     Social History   Socioeconomic History   Marital status: Married    Spouse name: Mckale Haffey   Number of  children: 2   Years of education: Not on file   Highest education level: Not on file  Occupational History   Occupation: retired  Tobacco Use   Smoking status: Every Day    Current packs/day: 1.00    Average packs/day: 1 pack/day for 60.0 years (60.0 ttl pk-yrs)    Types: Cigarettes   Smokeless tobacco: Current    Types: Chew  Vaping Use   Vaping status: Former  Substance and Sexual Activity   Alcohol use: Not Currently   Drug use: Never   Sexual activity: Not on file  Other Topics Concern   Not on file  Social History Narrative   Not on file   Social Drivers of Health   Financial Resource Strain: Low Risk  (04/10/2020)   Overall Financial Resource Strain (CARDIA)    Difficulty of Paying  Living Expenses: Not hard at all  Food Insecurity: No Food Insecurity (07/22/2022)   Hunger Vital Sign    Worried About Running Out of Food in the Last Year: Never true    Ran Out of Food in the Last Year: Never true  Transportation Needs: No Transportation Needs (04/10/2020)   PRAPARE - Administrator, Civil Service (Medical): No    Lack of Transportation (Non-Medical): No  Physical Activity: Inactive (04/10/2020)   Exercise Vital Sign    Days of Exercise per Week: 0 days    Minutes of Exercise per Session: 0 min  Stress: No Stress Concern Present (04/10/2020)   [employer] of Occupational Health - Occupational Stress Questionnaire    Feeling of Stress : Not at all  Social Connections: Not on file  Intimate Partner Violence: Not on file   Family History  Problem Relation Age of Onset   Heart disease Mother 82   Heart attack Mother    Diabetes Father 57   Current Outpatient Medications on File Prior to Visit  Medication Sig   albuterol (VENTOLIN HFA) 108 (90 Base) MCG/ACT inhaler Inhale into the lungs.   amLODipine (NORVASC) 10 MG tablet Take 1 tablet (10 mg total) by mouth daily.   aspirin EC 81 MG tablet Take 81 mg by mouth daily. Swallow whole.   glucose blood (ONETOUCH ULTRA) test strip Use to check blood sugar twice daily   ondansetron (ZOFRAN-ODT) 4 MG disintegrating tablet Take 1 tablet (4 mg total) by mouth every 8 (eight) hours as needed for nausea or vomiting.   simvastatin (ZOCOR) 20 MG tablet Take 1 tablet (20 mg total) by mouth at bedtime.   SYMBICORT 80-4.5 MCG/ACT inhaler Inhale 2 puffs into the lungs 2 (two) times daily.   traZODone (DESYREL) 50 MG tablet Take 1 tablet (50 mg total) by mouth at bedtime as needed for sleep.   No current facility-administered medications on file prior to visit.    Review of Systems  Constitutional:  Negative for activity change, appetite change, chills, diaphoresis, fatigue and fever.  HENT:  Negative for congestion  and hearing loss.   Eyes:  Negative for visual disturbance.  Respiratory:  Positive for cough. Negative for chest tightness, shortness of breath and wheezing.   Cardiovascular:  Negative for chest pain, palpitations and leg swelling.  Gastrointestinal:  Negative for abdominal pain, constipation, diarrhea, nausea and vomiting.  Genitourinary:  Negative for dysuria, frequency and hematuria.  Musculoskeletal:  Negative for arthralgias and neck pain.  Skin:  Negative for rash.  Neurological:  Negative for dizziness, weakness, light-headedness, numbness and headaches.  Hematological:  Negative for adenopathy.  Psychiatric/Behavioral:  Negative for behavioral problems, dysphoric mood and sleep disturbance.    Per HPI unless specifically indicated above     Objective:    BP 138/70 (BP Location: Left Arm, Cuff Size: Normal)   Pulse 100   Ht 5\' 9"  (1.753 m)   Wt 130 lb (59 kg)   SpO2 90%   BMI 19.20 kg/m   Wt Readings from Last 3 Encounters:  11/26/23 130 lb (59 kg)  08/26/23 129 lb (58.5 kg)  05/18/23 126 lb (57.2 kg)    Physical Exam Vitals and nursing note reviewed.  Constitutional:      General: He is not in acute distress.    Appearance: He is well-developed. He is not diaphoretic.     Comments: Well-appearing, comfortable, cooperative  HENT:     Head: Normocephalic and atraumatic.  Eyes:     General:        Right eye: No discharge.        Left eye: No discharge.     Conjunctiva/sclera: Conjunctivae normal.     Pupils: Pupils are equal, round, and reactive to light.  Neck:     Thyroid: No thyromegaly.  Cardiovascular:     Rate and Rhythm: Normal rate and regular rhythm.     Pulses: Normal pulses.     Heart sounds: Normal heart sounds. No murmur heard. Pulmonary:     Effort: Pulmonary effort is normal. No respiratory distress.     Breath sounds: Wheezing present. No rales.     Comments: Reduced air movement Abdominal:     General: Bowel sounds are normal. There is no  distension.     Palpations: Abdomen is soft. There is no mass.     Tenderness: There is no abdominal tenderness.  Musculoskeletal:        General: No tenderness. Normal range of motion.     Cervical back: Normal range of motion and neck supple.     Right lower leg: No edema.     Left lower leg: No edema.     Comments: Upper / Lower Extremities: - Normal muscle tone, strength bilateral upper extremities 5/5, lower extremities 5/5  Lymphadenopathy:     Cervical: No cervical adenopathy.  Skin:    General: Skin is warm and dry.     Findings: No erythema or rash.  Neurological:     Mental Status: He is alert and oriented to person, place, and time.     Comments: Distal sensation intact to light touch all extremities  Psychiatric:        Mood and Affect: Mood normal.        Behavior: Behavior normal.        Thought Content: Thought content normal.     Comments: Well groomed, good eye contact, normal speech and thoughts     Diabetic Foot Exam - Simple   Simple Foot Form Diabetic Foot exam was performed with the following findings: Yes 11/26/2023  8:46 AM  Visual Inspection See comments: Yes Sensation Testing Intact to touch and monofilament testing bilaterally: Yes Pulse Check Posterior Tibialis and Dorsalis pulse intact bilaterally: Yes Comments Mild dry skin with some early callus formation heel and forefoot. No ulceration. Intact monofilament.      Results for orders placed or performed in visit on 11/16/23  Urine Microalbumin w/creat. ratio   Collection Time: 11/16/23  8:32 AM  Result Value Ref Range   Creatinine, Urine 65 20 - 320 mg/dL   Microalb, Ur 1.3 mg/dL  Microalb Creat Ratio 20 <30 mg/g creat  PSA   Collection Time: 11/16/23  8:32 AM  Result Value Ref Range   PSA 0.16 < OR = 4.00 ng/mL  CBC with Differential/Platelet   Collection Time: 11/16/23  8:32 AM  Result Value Ref Range   WBC 9.2 3.8 - 10.8 Thousand/uL   RBC 4.39 4.20 - 5.80 Million/uL    Hemoglobin 12.8 (L) 13.2 - 17.1 g/dL   HCT 04.5 40.9 - 81.1 %   MCV 89.3 80.0 - 100.0 fL   MCH 29.2 27.0 - 33.0 pg   MCHC 32.7 32.0 - 36.0 g/dL   RDW 91.4 78.2 - 95.6 %   Platelets 314 140 - 400 Thousand/uL   MPV 10.1 7.5 - 12.5 fL   Neutro Abs 6,293 1,500 - 7,800 cells/uL   Absolute Lymphocytes 2,061 850 - 3,900 cells/uL   Absolute Monocytes 589 200 - 950 cells/uL   Eosinophils Absolute 221 15 - 500 cells/uL   Basophils Absolute 37 0 - 200 cells/uL   Neutrophils Relative % 68.4 %   Total Lymphocyte 22.4 %   Monocytes Relative 6.4 %   Eosinophils Relative 2.4 %   Basophils Relative 0.4 %  COMPLETE METABOLIC PANEL WITH GFR   Collection Time: 11/16/23  8:32 AM  Result Value Ref Range   Glucose, Bld 181 (H) 65 - 99 mg/dL   BUN 15 7 - 25 mg/dL   Creat 2.13 0.86 - 5.78 mg/dL   BUN/Creatinine Ratio SEE NOTE: 6 - 22 (calc)   Sodium 136 135 - 146 mmol/L   Potassium 4.2 3.5 - 5.3 mmol/L   Chloride 97 (L) 98 - 110 mmol/L   CO2 28 20 - 32 mmol/L   Calcium 9.3 8.6 - 10.3 mg/dL   Total Protein 6.4 6.1 - 8.1 g/dL   Albumin 3.9 3.6 - 5.1 g/dL   Globulin 2.5 1.9 - 3.7 g/dL (calc)   AG Ratio 1.6 1.0 - 2.5 (calc)   Total Bilirubin 0.8 0.2 - 1.2 mg/dL   Alkaline phosphatase (APISO) 56 35 - 144 U/L   AST 10 10 - 35 U/L   ALT 10 9 - 46 U/L  Lipid panel   Collection Time: 11/16/23  8:32 AM  Result Value Ref Range   Cholesterol 128 <200 mg/dL   HDL 50 > OR = 40 mg/dL   Triglycerides 469 (H) <150 mg/dL   LDL Cholesterol (Calc) 55 mg/dL (calc)   Total CHOL/HDL Ratio 2.6 <5.0 (calc)   Non-HDL Cholesterol (Calc) 78 <629 mg/dL (calc)  Hemoglobin B2W   Collection Time: 11/16/23  8:32 AM  Result Value Ref Range   Hgb A1c MFr Bld 8.6 (H) <5.7 % of total Hgb   Mean Plasma Glucose 200 mg/dL   eAG (mmol/L) 41.3 mmol/L      Assessment & Plan:   Problem List Items Addressed This Visit     Benign non-nodular prostatic hyperplasia without lower urinary tract symptoms   Centrilobular emphysema  (HCC)   Coronary artery disease involving native coronary artery without angina pectoris   Relevant Medications   lisinopril (ZESTRIL) 40 MG tablet   Essential hypertension   Relevant Medications   lisinopril (ZESTRIL) 40 MG tablet   Hyperlipidemia associated with type 2 diabetes mellitus (HCC)   Relevant Medications   lisinopril (ZESTRIL) 40 MG tablet   metFORMIN (GLUCOPHAGE-XR) 500 MG 24 hr tablet   Osteoarthritis of knees, bilateral   Type 2 diabetes mellitus with other specified complication (HCC)   Relevant  Medications   lisinopril (ZESTRIL) 40 MG tablet   metFORMIN (GLUCOPHAGE-XR) 500 MG 24 hr tablet   Other Visit Diagnoses       Annual physical exam    -  Primary     Long term current use of oral hypoglycemic drug         Need for Streptococcus pneumoniae vaccination       Relevant Orders   Pneumococcal conjugate vaccine 20-valent (Completed)     Gastroesophageal reflux disease, unspecified whether esophagitis present       Relevant Medications   omeprazole (PRILOSEC) 20 MG capsule        Updated Health Maintenance information Reviewed recent lab results with patient Encouraged improvement to lifestyle with diet and exercise Goal of weight loss   Chronic Obstructive Pulmonary Disease (COPD) Recent exacerbation improved with azithromycin and prednisone. Occasional exertional dyspnea. Smoking cessation in progress. Insurance issues with Symbicort. - Discuss Symbicort copay with pharmacy, explore alternatives if cost prohibitive. - Continue Ventolin as rescue inhaler. - Encouraged smoking cessation. Consider VBCI referral if high cost still on maintenance inhaler also can do sample Breztri AS NEEDED   Type 2 Diabetes Mellitus HbA1c increased to 8.6%, possibly due to prednisone. Adheres to metformin. - Continue metformin, 2 tablets twice daily. Discuss medication options, limited by cost and at this time he will make changes to diet first and we can repeat level next  apt - Recheck blood sugar in 6 months.  Hypertension Controlled on current regimen Refill therapy. Lisinopril, Amlodipine  Note history of Renal Mass Followed by Urology. He will continue surveillance and repeat imaging as planned.  General Health Maintenance Due for pneumonia vaccination. Recent influenza vaccination. Labs normal except slightly low blood count. - Administer pneumonia vaccination. - Schedule routine check-up in 6 months.        Orders Placed This Encounter  Procedures   Pneumococcal conjugate vaccine 20-valent    Meds ordered this encounter  Medications   lisinopril (ZESTRIL) 40 MG tablet    Sig: Take 1 tablet (40 mg total) by mouth daily.    Dispense:  90 tablet    Refill:  3    Add future refills   metFORMIN (GLUCOPHAGE-XR) 500 MG 24 hr tablet    Sig: Take 2 tablets (1,000 mg total) by mouth 2 (two) times daily with a meal.    Dispense:  360 tablet    Refill:  3    Add future refills   omeprazole (PRILOSEC) 20 MG capsule    Sig: Take 1 capsule (20 mg total) by mouth daily before breakfast.    Dispense:  90 capsule    Refill:  3     Follow up plan: Return for 6 month DM A1c.  [name], DO [facility] 11/26/2023, 8:40 AM

## 2023-11-26 NOTE — Patient Instructions (Addendum)
 Thank you for coming to the office today.  Recent Labs    05/18/23 0955 11/16/23 0832  HGBA1C 7.1* 8.6*   Check with pharmacy on the copay on the Symibocrt, if too high cost or need [place] to change med or call our clinical pharmacist to help with cost.  Refills sent.  Please schedule a Follow-up Appointment to: Return for 6 month DM A1c.  If you have any other questions or concerns, please feel free to call the office or send a message through MyChart. You may also schedule an earlier appointment if necessary.  Additionally, you may be receiving a survey about your experience at our office within a few days to 1 week by e-mail or mail. We value your feedback.  [name], DO [facility], [place]

## 2023-11-26 NOTE — Telephone Encounter (Signed)
 Requested by interface surescripts. Receipt confirmed by pharmacy 11/26/23 at 8:51 am. Duplicate request. Requested Prescriptions  Refused Prescriptions Disp Refills   metFORMIN (GLUCOPHAGE-XR) 500 MG 24 hr tablet [Pharmacy Med Name: METFORMIN ER 500MG  24HR TABS] 360 tablet 3    Sig: TAKE 2 TABLETS(1000 MG) BY MOUTH TWICE DAILY     Endocrinology:  Diabetes - Biguanides Failed - 11/26/2023  4:29 PM      Failed - HBA1C is between 0 and 7.9 and within 180 days    Hemoglobin A1C  Date Value Ref Range Status  09/29/2019 6.9  Final    Comment:    [facility]   Hgb A1c MFr Bld  Date Value Ref Range Status  11/16/2023 8.6 (H) <5.7 % of total Hgb Final    Comment:    For someone without known diabetes, a hemoglobin A1c value of 6.5% or greater indicates that they may have  diabetes and this should be confirmed with a follow-up  test. . For someone with known diabetes, a value <7% indicates  that their diabetes is well controlled and a value  greater than or equal to 7% indicates suboptimal  control. A1c targets should be individualized based on  duration of diabetes, age, comorbid conditions, and  other considerations. . Currently, no consensus exists regarding use of hemoglobin A1c for diagnosis of diabetes for children. .          Failed - B12 Level in normal range and within 720 days    No results found for: "VITAMINB12"       Failed - Valid encounter within last 6 months    Recent Outpatient Visits           Today Annual physical exam   [facility] Smitty Cords, DO       Future Appointments             In 9 months Vanna Scotland, MD [facility] [place]            Failed - CBC within normal limits and completed in the last 12 months    WBC  Date Value Ref Range Status  11/16/2023 9.2 3.8 - 10.8 Thousand/uL Final   RBC  Date Value Ref Range Status  11/16/2023 4.39 4.20 - 5.80 Million/uL Final    Hemoglobin  Date Value Ref Range Status  11/16/2023 12.8 (L) 13.2 - 17.1 g/dL Final   HCT  Date Value Ref Range Status  11/16/2023 39.2 38.5 - 50.0 % Final   MCHC  Date Value Ref Range Status  11/16/2023 32.7 32.0 - 36.0 g/dL Final    Comment:    For adults, a slight decrease in the calculated MCHC value (in the range of 30 to 32 g/dL) is most likely not clinically significant; however, it should be interpreted with caution in correlation with other red cell parameters and the patient's clinical condition.    [facility]  Date Value Ref Range Status  11/16/2023 29.2 27.0 - 33.0 pg Final   MCV  Date Value Ref Range Status  11/16/2023 89.3 80.0 - 100.0 fL Final   No results found for: "PLTCOUNTKUC", "LABPLAT", "POCPLA" RDW  Date Value Ref Range Status  11/16/2023 12.6 11.0 - 15.0 % Final         Passed - Cr in normal range and within 360 days    Creat  Date Value Ref Range Status  11/16/2023 0.94 0.70 - 1.22 mg/dL Final   Creatinine,  Urine  Date Value Ref Range Status  11/16/2023 65 20 - 320 mg/dL Final         Passed - eGFR in normal range and within 360 days    GFR, Est African American  Date Value Ref Range Status  04/05/2020 93 > OR = 60 mL/min/1.69m2 Final   GFR, Est Non African American  Date Value Ref Range Status  04/05/2020 80 > OR = 60 mL/min/1.96m2 Final   GFR, Estimated  Date Value Ref Range Status  02/23/2023 >60 >60 mL/min Final    Comment:    (NOTE) Calculated using the CKD-EPI Creatinine Equation (2021)    eGFR  Date Value Ref Range Status  06/20/2021 86 > OR = 60 mL/min/1.25m2 Final    Comment:    The eGFR is based on the CKD-EPI 2021 equation. To calculate  the new eGFR from a previous Creatinine or Cystatin C result, go to https://www.kidney.org/professionals/ kdoqi/gfr%5Fcalculator

## 2024-01-19 ENCOUNTER — Other Ambulatory Visit: Payer: Self-pay | Admitting: Family Medicine

## 2024-01-19 DIAGNOSIS — J432 Centrilobular emphysema: Secondary | ICD-10-CM

## 2024-01-19 NOTE — Telephone Encounter (Unsigned)
 Copied from CRM [id number]. Topic: Clinical - Medication Refill >> Jan 19, 2024  1:42 PM Ivette P wrote: Medication: SYMBICORT  80-4.5 MCG/ACT inhaler  Has the patient contacted their pharmacy? Yes (Agent: If no, request that the patient contact the pharmacy for the refill. If patient does not wish to contact the pharmacy document the reason why and proceed with request.) (Agent: If yes, when and what did the pharmacy advise?)  This is the patient's preferred pharmacy:  [facility]  [address] [place] [address] Phone: [phone] [phone]  Is this the correct pharmacy for this prescription? No If no, delete pharmacy and type the correct one.   Has the prescription been filled recently? No, 11/14/2022  Is the patient out of the medication? Unkown  Has the patient been seen for an appointment in the last year OR does the patient have an upcoming appointment? Yes, 05/30/2024  Can we respond through MyChart? Yes  Agent: Please be advised that Rx refills may take up to 3 business days. We ask that you follow-up with your pharmacy.

## 2024-01-20 MED ORDER — SYMBICORT 80-4.5 MCG/ACT IN AERO: 2.0000 | INHALATION_SPRAY | Freq: Two times a day (BID) | RESPIRATORY_TRACT | 11 refills | Status: DC

## 2024-01-20 NOTE — Telephone Encounter (Signed)
 Requested medication (s) are due for refill today: yes  Requested medication (s) are on the active medication list: yes  Last refill:  11/14/22 #10.2 g 11 refills  Future visit scheduled: no  Notes to clinic:  last OV 11/26/23. Expired medication date. Do you want to renew/refill  Rx?     Requested Prescriptions  Pending Prescriptions Disp Refills   SYMBICORT  80-4.5 MCG/ACT inhaler 10.2 g 11    Sig: Inhale 2 puffs into the lungs 2 (two) times daily.     Pulmonology:  Combination Products Failed - 01/20/2024  2:41 PM      Failed - Valid encounter within last 12 months    Recent Outpatient Visits           1 month ago Annual physical exam   [facility] [place], Kayleen Party, DO       Future Appointments             In 7 months Dustin Gimenez, MD [facility]

## 2024-01-27 ENCOUNTER — Ambulatory Visit (INDEPENDENT_AMBULATORY_CARE_PROVIDER_SITE_OTHER): Admitting: Family Medicine

## 2024-01-27 ENCOUNTER — Encounter: Payer: Self-pay | Admitting: Family Medicine

## 2024-01-27 VITALS — BP 140/72 | HR 94 | Ht 69.0 in | Wt 128.0 lb

## 2024-01-27 DIAGNOSIS — Z7984 Long term (current) use of oral hypoglycemic drugs: Secondary | ICD-10-CM | POA: Diagnosis not present

## 2024-01-27 DIAGNOSIS — M7551 Bursitis of right shoulder: Secondary | ICD-10-CM | POA: Diagnosis not present

## 2024-01-27 DIAGNOSIS — J432 Centrilobular emphysema: Secondary | ICD-10-CM | POA: Diagnosis not present

## 2024-01-27 DIAGNOSIS — E1169 Type 2 diabetes mellitus with other specified complication: Secondary | ICD-10-CM

## 2024-01-27 MED ORDER — ALBUTEROL SULFATE HFA 108 (90 BASE) MCG/ACT IN AERS: 2.0000 | INHALATION_SPRAY | Freq: Four times a day (QID) | RESPIRATORY_TRACT | 3 refills | Status: AC | PRN

## 2024-01-27 NOTE — Progress Notes (Signed)
 Subjective:    Patient ID: Stephen Savage, male    DOB: August 11, 1941, 83 y.o.   MRN: [id number]  Stephen Savage is a 83 y.o. male presenting on 01/27/2024 for COPD   HPI  Discussed the use of AI scribe software for clinical note transcription with the patient, who gave verbal consent to proceed.  History of Present Illness   [name] is an 83 year old male with diabetes who presents with concerns about his inhaler and shoulder pain.  His blood sugar was 281 mg/dL this morning, which he states is average for him in the early morning. He takes metformin  and eats multiple grain cereal with no sugar. He feels good in the mornings but experiences low energy in the evenings.  He is concerned about the cost of Symbicort , which has increased significantly, and is considering alternatives due to financial constraints. He has been using an alternative inhaler provided by urgent care, which is not as effective as Symbicort .  He is experiencing shoulder pain, particularly when raising his arm or reaching over. The pain is described as aching and does not prevent him from performing activities. He recalls having arthritis in the past, which was treated surgically. He uses Tylenol  to manage the pain and has been sleeping well with gabapentin at night.         11/14/2022   10:19 AM 07/16/2022    9:35 AM 06/18/2021    1:59 PM  Depression screen PHQ 2/9  Decreased Interest 0 0 0  Down, Depressed, Hopeless 0 0 0  PHQ - 2 Score 0 0 0  Altered sleeping 0    Tired, decreased energy 1    Change in appetite 0    Feeling bad or failure about yourself  0    Trouble concentrating 0    Moving slowly or fidgety/restless 0    Suicidal thoughts 0    PHQ-9 Score 1    Difficult doing work/chores Not difficult at all         11/14/2022   10:19 AM  GAD 7 : Generalized Anxiety Score  Nervous, Anxious, on Edge 0  Control/stop worrying 0  Worry too much - different things 0  Trouble relaxing 0   Restless 0  Easily annoyed or irritable 0  Afraid - awful might happen 0  Total GAD 7 Score 0  Anxiety Difficulty Not difficult at all    Social History   Tobacco Use   Smoking status: Every Day    Current packs/day: 1.00    Average packs/day: 1 pack/day for 60.0 years (60.0 ttl pk-yrs)    Types: Cigarettes   Smokeless tobacco: Current    Types: Chew  Vaping Use   Vaping status: Former  Substance Use Topics   Alcohol use: Not Currently   Drug use: Never    Review of Systems Per HPI unless specifically indicated above     Objective:     BP (!) 140/72 (BP Location: Left Arm, Patient Position: Sitting, Cuff Size: Normal)   Pulse 94   Ht 5' 9 (1.753 m)   Wt 128 lb (58.1 kg)   SpO2 95%   BMI 18.90 kg/m   Wt Readings from Last 3 Encounters:  01/27/24 128 lb (58.1 kg)  11/26/23 130 lb (59 kg)  08/26/23 129 lb (58.5 kg)    Physical Exam Vitals and nursing note reviewed.  Constitutional:      General: He is not in acute distress.  Appearance: Normal appearance. He is well-developed. He is not diaphoretic.     Comments: Well-appearing, comfortable, cooperative  HENT:     Head: Normocephalic and atraumatic.  Eyes:     General:        Right eye: No discharge.        Left eye: No discharge.     Conjunctiva/sclera: Conjunctivae normal.  Cardiovascular:     Rate and Rhythm: Normal rate.  Pulmonary:     Effort: Pulmonary effort is normal.  Skin:    General: Skin is warm and dry.     Findings: No erythema or rash.  Neurological:     Mental Status: He is alert and oriented to person, place, and time.  Psychiatric:        Mood and Affect: Mood normal.        Behavior: Behavior normal.        Thought Content: Thought content normal.     Comments: Well groomed, good eye contact, normal speech and thoughts     Results for orders placed or performed in visit on 11/16/23  Urine Microalbumin w/creat. ratio   Collection Time: 11/16/23  8:32 AM  Result Value Ref  Range   Creatinine, Urine 65 20 - 320 mg/dL   Microalb, Ur 1.3 mg/dL   Microalb Creat Ratio 20 <30 mg/g creat  PSA   Collection Time: 11/16/23  8:32 AM  Result Value Ref Range   PSA 0.16 < OR = 4.00 ng/mL  CBC with Differential/Platelet   Collection Time: 11/16/23  8:32 AM  Result Value Ref Range   WBC 9.2 3.8 - 10.8 Thousand/uL   RBC 4.39 4.20 - 5.80 Million/uL   Hemoglobin 12.8 (L) 13.2 - 17.1 g/dL   HCT 40.9 81.1 - 91.4 %   MCV 89.3 80.0 - 100.0 fL   MCH 29.2 27.0 - 33.0 pg   MCHC 32.7 32.0 - 36.0 g/dL   RDW 78.2 95.6 - 21.3 %   Platelets 314 140 - 400 Thousand/uL   MPV 10.1 7.5 - 12.5 fL   Neutro Abs 6,293 1,500 - 7,800 cells/uL   Absolute Lymphocytes 2,061 850 - 3,900 cells/uL   Absolute Monocytes 589 200 - 950 cells/uL   Eosinophils Absolute 221 15 - 500 cells/uL   Basophils Absolute 37 0 - 200 cells/uL   Neutrophils Relative % 68.4 %   Total Lymphocyte 22.4 %   Monocytes Relative 6.4 %   Eosinophils Relative 2.4 %   Basophils Relative 0.4 %  COMPLETE METABOLIC PANEL WITH GFR   Collection Time: 11/16/23  8:32 AM  Result Value Ref Range   Glucose, Bld 181 (H) 65 - 99 mg/dL   BUN 15 7 - 25 mg/dL   Creat 0.86 5.78 - 4.69 mg/dL   BUN/Creatinine Ratio SEE NOTE: 6 - 22 (calc)   Sodium 136 135 - 146 mmol/L   Potassium 4.2 3.5 - 5.3 mmol/L   Chloride 97 (L) 98 - 110 mmol/L   CO2 28 20 - 32 mmol/L   Calcium 9.3 8.6 - 10.3 mg/dL   Total Protein 6.4 6.1 - 8.1 g/dL   Albumin 3.9 3.6 - 5.1 g/dL   Globulin 2.5 1.9 - 3.7 g/dL (calc)   AG Ratio 1.6 1.0 - 2.5 (calc)   Total Bilirubin 0.8 0.2 - 1.2 mg/dL   Alkaline phosphatase (APISO) 56 35 - 144 U/L   AST 10 10 - 35 U/L   ALT 10 9 - 46 U/L  Lipid panel  Collection Time: 11/16/23  8:32 AM  Result Value Ref Range   Cholesterol 128 <200 mg/dL   HDL 50 > OR = 40 mg/dL   Triglycerides 811 (H) <150 mg/dL   LDL Cholesterol (Calc) 55 mg/dL (calc)   Total CHOL/HDL Ratio 2.6 <5.0 (calc)   Non-HDL Cholesterol (Calc) 78 <914  mg/dL (calc)  Hemoglobin N8G   Collection Time: 11/16/23  8:32 AM  Result Value Ref Range   Hgb A1c MFr Bld 8.6 (H) <5.7 % of total Hgb   Mean Plasma Glucose 200 mg/dL   eAG (mmol/L) 95.6 mmol/L      Assessment & Plan:   Problem List Items Addressed This Visit     Centrilobular emphysema (HCC) - Primary   Relevant Medications   albuterol (VENTOLIN HFA) 108 (90 Base) MCG/ACT inhaler   Other Relevant Orders   AMB Referral VBCI Care Management   Type 2 diabetes mellitus with other specified complication (HCC)   Relevant Orders   AMB Referral VBCI Care Management   Other Visit Diagnoses       Chronic bursitis of right shoulder         Long term current use of oral hypoglycemic drug            Diabetes Mellitus Type 2 Blood glucose levels elevated at 281 mg/dL. Current metformin  may be insufficient. Cost concerns for additional medication. - Consult pharmacist for cost-effective medication options to add to metformin . - Assess insurance coverage for affordable medication options.  Chronic Obstructive Pulmonary Disease (COPD) Current inhaler somewhat effective. Unable to afford Symbicort . Medicare changes may reduce costs. - Consult pharmacist for affordable alternatives to Symbicort . - Order refill of current inhaler at [facility]. - Pharmacist to explain Medicare's fee structure and potential cost reductions.  Shoulder Pain Pain with arm elevation likely due to wear and tear or arthritis. Corticosteroid injection discussed but may affect blood glucose. - Recommend topical muscle rub and heat. - Consider corticosteroid injection if pain persists and blood glucose levels are controlled.  Follow-up Next appointment moved to September for monitoring. - Schedule follow-up appointment in September for blood glucose and shoulder pain evaluation. - Ensure appointment is scheduled for early morning.        Referral to [facility] Pharmacist Ask about DM med cost and inhaler  maintenance COPD  Orders Placed This Encounter  Procedures   AMB Referral VBCI Care Management    Referral Priority:   Routine    Referral Type:   Consultation    Referral Reason:   Care Coordination    Number of Visits Requested:   1    Meds ordered this encounter  Medications   albuterol (VENTOLIN HFA) 108 (90 Base) MCG/ACT inhaler    Sig: Inhale 2 puffs into the lungs every 6 (six) hours as needed for wheezing or shortness of breath.    Dispense:  18 g    Refill:  3    Follow up plan: Return in about 3 months (around 04/28/2024) for 3 month follow up DM A1c, R Shoulder Injection(?) COPD.  Domingo Friend, DO [facility] 01/27/2024, 9:46 AM

## 2024-01-27 NOTE — Patient Instructions (Addendum)
 Thank you for coming to the office today.  Recent Labs    05/18/23 0955 11/16/23 0832  HGBA1C 7.1* 8.6*   We will refer to our clinical pharmacist Timoteo Force. She will call you to discuss the DAILY inhaler (Symbicort  or something else) and Diabetes medication in addition to metformin .  START anti inflammatory topical - OTC Voltaren (generic Diclofenac) topical 2-4 times a day as needed for pain swelling of affected joint for 1-2 weeks or longer.  Apply heat.  Keep on Gabapentin in evening.  If we can improve the blood sugar, we can consider joint injection.  Please schedule a Follow-up Appointment to: Return in about 3 months (around 04/28/2024) for 3 month follow up DM A1c, R Shoulder Injection(?) COPD.  If you have any other questions or concerns, please feel free to call the office or send a message through MyChart. You may also schedule an earlier appointment if necessary.  Additionally, you may be receiving a survey about your experience at our office within a few days to 1 week by e-mail or mail. We value your feedback.  Domingo Friend, DO [facility], [place]

## 2024-02-01 ENCOUNTER — Telehealth: Payer: Self-pay

## 2024-02-01 NOTE — Progress Notes (Signed)
 Care Guide Pharmacy Note  02/01/2024 Name: Stephen Savage MRN: [id number] DOB: [birth year]  Referred By: Raina Bunting, DO Reason for referral: Complex Care Management (Outreach to schedule with Pharm d )   Stephen Savage is a 83 y.o. year old male who is a primary care patient of Raina Bunting, DO.  Stephen Savage was referred to the pharmacist for assistance related to: DMII and emphysema  Successful contact was made with the patient to discuss pharmacy services including being ready for the pharmacist to call at least 5 minutes before the scheduled appointment time and to have medication bottles and any blood pressure readings ready for review. The patient agreed to meet with the pharmacist via telephone visit on (date/time).02/05/2024  Stephen Savage , RMA     [facility], [facility] Guide  Direct Dial: [phone]  Website: [email or web address]

## 2024-02-05 ENCOUNTER — Other Ambulatory Visit

## 2024-02-05 DIAGNOSIS — Z7984 Long term (current) use of oral hypoglycemic drugs: Secondary | ICD-10-CM

## 2024-02-05 DIAGNOSIS — J432 Centrilobular emphysema: Secondary | ICD-10-CM

## 2024-02-05 DIAGNOSIS — E1169 Type 2 diabetes mellitus with other specified complication: Secondary | ICD-10-CM

## 2024-02-05 NOTE — Patient Instructions (Signed)
 Goals Addressed             This Visit's Progress    Pharmacy Goals       The goal A1c is less than 7%. This is the best way to reduce the risk of the long term complications of diabetes, including heart disease, kidney disease, eye disease, strokes, and nerve damage. An A1c of less than 7% corresponds with fasting sugars less than 130 and 2 hour after meal sugars less than 180.   Please watch the mail for an envelope from [facility] containing the patient assistance program applications. Please complete this application and bring to office to have it faxed back to Attention: Palmer Bobo at Fax # [phone] along with a copy of your Medicare Part D prescription card and a copy of your proof of income document.  If you need to call Jullie Oiler, you can reach her at [phone].  Thank you!  Arthur Lash, PharmD, Becky Bowels, CPP Clinical Pharmacist [facility] [phone]

## 2024-02-05 NOTE — Progress Notes (Unsigned)
 02/05/2024 Name: Stephen Savage MRN: [id number] DOB: [birth year]  Chief Complaint  Patient presents with   Medication Management   Medication Assistance    Stephen Savage is a 83 y.o. year old male who presented for a telephone visit.   They were referred to the pharmacist by their PCP for assistance in managing diabetes and medication access.    Subjective:  Care Team: Primary Care Provider: [name], DO ; Next Scheduled Visit: 05/04/2024 Cardiologist: [name], MD  Urologist: [name], MD   Medication Access/Adherence  Current Pharmacy:  [facility]ORE [phone] [place], [place] - [address] AT [facility] OF [address] [place] [address] [place] [address] Phone: [phone] Fax: [phone]  [facility] DR[facility] STORE #[id number] [name], [place] - [address] AT [facility] OF [address] [place] [address] Phone: [phone] Fax: [phone]   Patient reports affordability concerns with their medications: Yes  Patient reports access/transportation concerns to their pharmacy: No  Patient reports adherence concerns with their medications:  No    Barriers: reports limited literacy  Reports cost of Symbicort  inhaler is difficult to afford  Query adherence to simvastatin . Based on dispensing record in chart, simvastatin  prescription last filled 09/15/2023 for 90 day supply Patient denies missed doses and states has supply remaining at home, but declines to review medication bottles  Diabetes:  Current medications:  - metformin  ER 500 mg - 2 tablets twice daily  Medications tried in the past: Januvia  (cost), Rybelsus  (cost)  Current glucose readings: reports morning fasting blood sugar typically ranging 140-160, but up to 200 if has sweets in evening  Patient denies hypoglycemic s/sx including dizziness, shakiness, sweating.   Reports limits portion sizes of carbohydrates and  sweets   COPD:  Current medications: - albuterol  inhaler - 2 puffs every 6 hours as needed for wheezing or shortness of breath - Reports has been out of Symbicort  inhaler for months due to cost  Medications tried in the past: Symbicort  (cost)    Objective:  Lab Results  Component Value Date   HGBA1C 8.6 (H) 11/16/2023    Lab Results  Component Value Date   CREATININE 0.94 11/16/2023   BUN 15 11/16/2023   NA 136 11/16/2023   K 4.2 11/16/2023   CL 97 (L) 11/16/2023   CO2 28 11/16/2023    Lab Results  Component Value Date   CHOL 128 11/16/2023   HDL 50 11/16/2023   LDLCALC 55 11/16/2023   TRIG 152 (H) 11/16/2023   CHOLHDL 2.6 11/16/2023    Current Outpatient Medications on File Prior to Visit  Medication Sig Dispense Refill   albuterol  (VENTOLIN  HFA) 108 (90 Base) MCG/ACT inhaler Inhale 2 puffs into the lungs every 6 (six) hours as needed for wheezing or shortness of breath. 18 g 3   amLODipine  (NORVASC ) 10 MG tablet Take 1 tablet (10 mg total) by mouth daily. 90 tablet 3   lisinopril  (ZESTRIL ) 40 MG tablet Take 1 tablet (40 mg total) by mouth daily. 90 tablet 3   metFORMIN  (GLUCOPHAGE -XR) 500 MG 24 hr tablet Take 2 tablets (1,000 mg total) by mouth 2 (two) times daily with a meal. 360 tablet 3   aspirin EC 81 MG tablet Take 81 mg by mouth daily. Swallow whole.     glucose blood (ONETOUCH ULTRA) test strip Use to check blood sugar twice daily 100 strip 5   omeprazole  (PRILOSEC)  20 MG capsule Take 1 capsule (20 mg total) by mouth daily before breakfast. 90 capsule 3   ondansetron  (ZOFRAN -ODT) 4 MG disintegrating tablet Take 1 tablet (4 mg total) by mouth every 8 (eight) hours as needed for nausea or vomiting. 15 tablet 0   simvastatin  (ZOCOR ) 20 MG tablet Take 1 tablet (20 mg total) by mouth at bedtime. 90 tablet 3   SYMBICORT  80-4.5 MCG/ACT inhaler Inhale 2 puffs into the lungs 2 (two) times daily. (Patient not taking: Reported on 02/05/2024) 10.2 g 11   traZODone   (DESYREL ) 50 MG tablet Take 1 tablet (50 mg total) by mouth at bedtime as needed for sleep. 30 tablet 3   No current facility-administered medications on file prior to visit.        Assessment/Plan:   Declines to complete comprehensive medication review with pharmacist  Counsel on importance of adherence to simvastatin . Encourage patient to double check supply and consider contacting pharmacy for refill.  COPD: - Reviewed appropriate inhaler technique. - From review of [employer], note patient's prescription plan has a $255 annual deductible (tiers 3-5). Copayment for tier 3 medications, such as Symbicort  inhaler is $47/month Patient advises cost is unaffordable Patient would like to apply to receive inhaler through patient assistance program Collaborate with PCP regarding option to switch patient from Symbicort  to to LABA / LAMA  combination inhaler Bevespi for medication access. Provider agrees - Discuss option for patient to pick up a supply of generic Advair inhaler for ~$56 for 1 month supply using GoodRx coupon.  Patient declines as prefers to wait until able to receive supply of new maintenance inhaler through assistance program - Meets financial criteria for [facility] patient assistance program through [employer]. Will collaborate with provider, CPhT, and patient to pursue assistance.   Diabetes: - Currently uncontrolled - Reviewed long term cardiovascular and renal outcomes of uncontrolled blood sugar - Reviewed goal A1c, goal fasting, and goal 2 hour post prandial glucose - Reviewed dietary modifications including importance of having regular well-balanced meals and snacks throughout the day, while controlling carbohydrate portion sizes             Encourage patient to increase consumption of non-starchy vegetables - Discuss diabetes medication management options. Patient advises that he is concerned about avoiding weight loss. Patient agreeable to start Januvia  100  mg daily if medication affordable - Meets financial criteria for Januvia  patient assistance program through [employer]. Will collaborate with provider, CPhT, and patient to pursue assistance.  - Recommend to check glucose, keep log of results and have this record to review at upcoming medical appointments. Patient to contact provider office sooner if needed for readings outside of established parameters or symptoms   Follow Up Plan: Clinical Pharmacist will follow up with patient by telephone next month  Stephen Savage, PharmD, JAQUELINE, CPP Clinical Pharmacist [facility] [phone]

## 2024-02-08 ENCOUNTER — Telehealth: Payer: Self-pay

## 2024-02-08 NOTE — Telephone Encounter (Signed)
 PAP: Patient assistance application for Bevespi through [employer] ([employer]) has been mailed to pt's home address on file. Provider portion of application will be faxed to provider's office.

## 2024-02-08 NOTE — Telephone Encounter (Signed)
 PAP: Patient assistance application for Januvia through [employer] has been mailed to pt's home address on file. Provider portion of application will be faxed to provider's office.

## 2024-02-11 ENCOUNTER — Telehealth: Payer: Self-pay

## 2024-02-11 NOTE — Telephone Encounter (Signed)
 Received Provider portion of PAP application for Januvia  ([employer]).

## 2024-02-11 NOTE — Telephone Encounter (Signed)
 Copied from CRM [id number]. Topic: General - Call Back - No Documentation >> Feb 11, 2024  3:00 PM DeAngela L wrote: Reason for CRM: Pt returning a call he states someone called him this morning and he missed the call (patient would like a call back he is concerned about what he light have missed with the call) Pt num [phone] (H)

## 2024-02-11 NOTE — Telephone Encounter (Signed)
 Received Provider portion of PAP Application for Bevespi ([employer])

## 2024-02-13 ENCOUNTER — Other Ambulatory Visit: Payer: Self-pay | Admitting: Family Medicine

## 2024-02-13 DIAGNOSIS — E1169 Type 2 diabetes mellitus with other specified complication: Secondary | ICD-10-CM

## 2024-02-15 NOTE — Telephone Encounter (Signed)
 Requested Prescriptions  Refused Prescriptions Disp Refills   metFORMIN  (GLUCOPHAGE -XR) 500 MG 24 hr tablet [Pharmacy Med Name: METFORMIN  ER 500MG  24HR TABS] 360 tablet 3    Sig: TAKE 2 TABLETS(1000 MG) BY MOUTH TWICE DAILY     Endocrinology:  Diabetes - Biguanides Failed - 02/15/2024  3:23 PM      Failed - HBA1C is between 0 and 7.9 and within 180 days    Hemoglobin A1C  Date Value Ref Range Status  09/29/2019 6.9  Final    Comment:    [facility]   Hgb A1c MFr Bld  Date Value Ref Range Status  11/16/2023 8.6 (H) <5.7 % of total Hgb Final    Comment:    For someone without known diabetes, a hemoglobin A1c value of 6.5% or greater indicates that they may have  diabetes and this should be confirmed with a follow-up  test. . For someone with known diabetes, a value <7% indicates  that their diabetes is well controlled and a value  greater than or equal to 7% indicates suboptimal  control. A1c targets should be individualized based on  duration of diabetes, age, comorbid conditions, and  other considerations. . Currently, no consensus exists regarding use of hemoglobin A1c for diagnosis of diabetes for children. .          Failed - B12 Level in normal range and within 720 days    No results found for: VITAMINB12       Passed - Cr in normal range and within 360 days    Creat  Date Value Ref Range Status  11/16/2023 0.94 0.70 - 1.22 mg/dL Final   Creatinine, Urine  Date Value Ref Range Status  11/16/2023 65 20 - 320 mg/dL Final         Passed - eGFR in normal range and within 360 days    GFR, Est African American  Date Value Ref Range Status  04/05/2020 93 > OR = 60 mL/min/1.72m2 Final   GFR, Est Non African American  Date Value Ref Range Status  04/05/2020 80 > OR = 60 mL/min/1.27m2 Final   GFR, Estimated  Date Value Ref Range Status  02/23/2023 >60 >60 mL/min Final    Comment:    (NOTE) Calculated using the CKD-EPI Creatinine Equation (2021)     eGFR  Date Value Ref Range Status  06/20/2021 86 > OR = 60 mL/min/1.20m2 Final    Comment:    The eGFR is based on the CKD-EPI 2021 equation. To calculate  the new eGFR from a previous Creatinine or Cystatin C result, go to https://www.kidney.org/professionals/ kdoqi/gfr%5Fcalculator          Passed - Valid encounter within last 6 months    Recent Outpatient Visits           2 weeks ago Centrilobular emphysema [facility])   [facility] [place], [name], DO   2 months ago Annual physical exam   [facility] [name], DO       Future Appointments             In 6 months [name], MD [facility] [place]            Passed - CBC within normal limits and completed in the last 12 months    WBC  Date Value Ref Range Status  11/16/2023 9.2 3.8 - 10.8 Thousand/uL Final   RBC  Date Value Ref Range  Status  11/16/2023 4.39 4.20 - 5.80 Million/uL Final   Hemoglobin  Date Value Ref Range Status  11/16/2023 12.8 (L) 13.2 - 17.1 g/dL Final   HCT  Date Value Ref Range Status  11/16/2023 39.2 38.5 - 50.0 % Final   MCHC  Date Value Ref Range Status  11/16/2023 32.7 32.0 - 36.0 g/dL Final    Comment:    For adults, a slight decrease in the calculated MCHC value (in the range of 30 to 32 g/dL) is most likely not clinically significant; however, it should be interpreted with caution in correlation with other red cell parameters and the patient's clinical condition.    [facility]  Date Value Ref Range Status  11/16/2023 29.2 27.0 - 33.0 pg Final   MCV  Date Value Ref Range Status  11/16/2023 89.3 80.0 - 100.0 fL Final   No results found for: PLTCOUNTKUC, LABPLAT, POCPLA RDW  Date Value Ref Range Status  11/16/2023 12.6 11.0 - 15.0 % Final

## 2024-02-17 ENCOUNTER — Other Ambulatory Visit: Payer: Self-pay | Admitting: Family Medicine

## 2024-02-17 DIAGNOSIS — E1169 Type 2 diabetes mellitus with other specified complication: Secondary | ICD-10-CM

## 2024-02-19 NOTE — Telephone Encounter (Signed)
 Requested Prescriptions  Refused Prescriptions Disp Refills   metFORMIN  (GLUCOPHAGE -XR) 500 MG 24 hr tablet [Pharmacy Med Name: METFORMIN  ER 500MG  24HR TABS] 360 tablet 3    Sig: TAKE 2 TABLETS(1000 MG) BY MOUTH TWICE DAILY     Endocrinology:  Diabetes - Biguanides Failed - 02/19/2024  5:28 PM      Failed - HBA1C is between 0 and 7.9 and within 180 days    Hemoglobin A1C  Date Value Ref Range Status  09/29/2019 6.9  Final    Comment:    [facility]   Hgb A1c MFr Bld  Date Value Ref Range Status  11/16/2023 8.6 (H) <5.7 % of total Hgb Final    Comment:    For someone without known diabetes, a hemoglobin A1c value of 6.5% or greater indicates that they may have  diabetes and this should be confirmed with a follow-up  test. . For someone with known diabetes, a value <7% indicates  that their diabetes is well controlled and a value  greater than or equal to 7% indicates suboptimal  control. A1c targets should be individualized based on  duration of diabetes, age, comorbid conditions, and  other considerations. . Currently, no consensus exists regarding use of hemoglobin A1c for diagnosis of diabetes for children. .          Failed - eGFR in normal range and within 360 days    GFR, Est African American  Date Value Ref Range Status  04/05/2020 93 > OR = 60 mL/min/1.52m2 Final   GFR, Est Non African American  Date Value Ref Range Status  04/05/2020 80 > OR = 60 mL/min/1.36m2 Final   GFR, Estimated  Date Value Ref Range Status  02/23/2023 >60 >60 mL/min Final    Comment:    (NOTE) Calculated using the CKD-EPI Creatinine Equation (2021)    eGFR  Date Value Ref Range Status  06/20/2021 86 > OR = 60 mL/min/1.63m2 Final    Comment:    The eGFR is based on the CKD-EPI 2021 equation. To calculate  the new eGFR from a previous Creatinine or Cystatin C result, go to https://www.kidney.org/professionals/ kdoqi/gfr%5Fcalculator          Failed - B12 Level in  normal range and within 720 days    No results found for: VITAMINB12       Passed - Cr in normal range and within 360 days    Creat  Date Value Ref Range Status  11/16/2023 0.94 0.70 - 1.22 mg/dL Final   Creatinine, Urine  Date Value Ref Range Status  11/16/2023 65 20 - 320 mg/dL Final         Passed - Valid encounter within last 6 months    Recent Outpatient Visits           3 weeks ago Centrilobular emphysema [facility])   [place] [facility] [place], [name], DO   2 months ago Annual physical exam   [place] [facility] [name], DO       Future Appointments             In 6 months [name], MD [facility] [place]            Passed - CBC within normal limits and completed in the last 12 months    WBC  Date Value Ref Range Status  11/16/2023 9.2 3.8 - 10.8 Thousand/uL Final   RBC  Date Value Ref Range  Status  11/16/2023 4.39 4.20 - 5.80 Million/uL Final   Hemoglobin  Date Value Ref Range Status  11/16/2023 12.8 (L) 13.2 - 17.1 g/dL Final   HCT  Date Value Ref Range Status  11/16/2023 39.2 38.5 - 50.0 % Final   MCHC  Date Value Ref Range Status  11/16/2023 32.7 32.0 - 36.0 g/dL Final    Comment:    For adults, a slight decrease in the calculated MCHC value (in the range of 30 to 32 g/dL) is most likely not clinically significant; however, it should be interpreted with caution in correlation with other red cell parameters and the patient's clinical condition.    [facility]  Date Value Ref Range Status  11/16/2023 29.2 27.0 - 33.0 pg Final   MCV  Date Value Ref Range Status  11/16/2023 89.3 80.0 - 100.0 fL Final   No results found for: PLTCOUNTKUC, LABPLAT, POCPLA RDW  Date Value Ref Range Status  11/16/2023 12.6 11.0 - 15.0 % Final

## 2024-02-22 ENCOUNTER — Other Ambulatory Visit: Payer: Self-pay | Admitting: Family Medicine

## 2024-02-22 DIAGNOSIS — E1169 Type 2 diabetes mellitus with other specified complication: Secondary | ICD-10-CM

## 2024-02-23 NOTE — Telephone Encounter (Signed)
 PAP: Application for Bevespi has been submitted to [employer] ([employer]), via fax

## 2024-02-23 NOTE — Telephone Encounter (Signed)
 PAP: Application for Januvia has been submitted to [employer], via fax

## 2024-02-23 NOTE — Telephone Encounter (Signed)
 PAP: Patient assistance application for Bevespi has been approved by PAP Companies: [employer] from 02/23/2024 to 08/17/2024. Medication should be delivered to PAP Delivery: Home. For further shipping updates, please contact [employer] ([employer]) at [phone]. Patient ID is: [id number]

## 2024-02-24 NOTE — Telephone Encounter (Signed)
 Refilled 11/26/23 #360 with 3 refills. Requested Prescriptions  Refused Prescriptions Disp Refills   metFORMIN  (GLUCOPHAGE -XR) 500 MG 24 hr tablet [Pharmacy Med Name: METFORMIN  ER 500MG  24HR TABS] 360 tablet 3    Sig: TAKE 2 TABLETS(1000 MG) BY MOUTH TWICE DAILY     Endocrinology:  Diabetes - Biguanides Failed - 02/24/2024  1:59 PM      Failed - HBA1C is between 0 and 7.9 and within 180 days    Hemoglobin A1C  Date Value Ref Range Status  09/29/2019 6.9  Final    Comment:    [facility]   Hgb A1c MFr Bld  Date Value Ref Range Status  11/16/2023 8.6 (H) <5.7 % of total Hgb Final    Comment:    For someone without known diabetes, a hemoglobin A1c value of 6.5% or greater indicates that they may have  diabetes and this should be confirmed with a follow-up  test. . For someone with known diabetes, a value <7% indicates  that their diabetes is well controlled and a value  greater than or equal to 7% indicates suboptimal  control. A1c targets should be individualized based on  duration of diabetes, age, comorbid conditions, and  other considerations. . Currently, no consensus exists regarding use of hemoglobin A1c for diagnosis of diabetes for children. .          Failed - eGFR in normal range and within 360 days    GFR, Est African American  Date Value Ref Range Status  04/05/2020 93 > OR = 60 mL/min/1.4m2 Final   GFR, Est Non African American  Date Value Ref Range Status  04/05/2020 80 > OR = 60 mL/min/1.36m2 Final   GFR, Estimated  Date Value Ref Range Status  02/23/2023 >60 >60 mL/min Final    Comment:    (NOTE) Calculated using the CKD-EPI Creatinine Equation (2021)    eGFR  Date Value Ref Range Status  06/20/2021 86 > OR = 60 mL/min/1.81m2 Final    Comment:    The eGFR is based on the CKD-EPI 2021 equation. To calculate  the new eGFR from a previous Creatinine or Cystatin C result, go to  https://www.kidney.org/professionals/ kdoqi/gfr%5Fcalculator          Failed - B12 Level in normal range and within 720 days    No results found for: VITAMINB12       Passed - Cr in normal range and within 360 days    Creat  Date Value Ref Range Status  11/16/2023 0.94 0.70 - 1.22 mg/dL Final   Creatinine, Urine  Date Value Ref Range Status  11/16/2023 65 20 - 320 mg/dL Final         Passed - Valid encounter within last 6 months    Recent Outpatient Visits           4 weeks ago Centrilobular emphysema [facility])   [place] [facility] [place], [name], DO   3 months ago Annual physical exam   [place] [facility] [name], DO       Future Appointments             In 6 months [name], MD [facility] [place]            Passed - CBC within normal limits and completed in the last 12 months    WBC  Date Value Ref Range Status  11/16/2023 9.2 3.8 - 10.8 Thousand/uL Final  RBC  Date Value Ref Range Status  11/16/2023 4.39 4.20 - 5.80 Million/uL Final   Hemoglobin  Date Value Ref Range Status  11/16/2023 12.8 (L) 13.2 - 17.1 g/dL Final   HCT  Date Value Ref Range Status  11/16/2023 39.2 38.5 - 50.0 % Final   MCHC  Date Value Ref Range Status  11/16/2023 32.7 32.0 - 36.0 g/dL Final    Comment:    For adults, a slight decrease in the calculated MCHC value (in the range of 30 to 32 g/dL) is most likely not clinically significant; however, it should be interpreted with caution in correlation with other red cell parameters and the patient's clinical condition.    [facility]  Date Value Ref Range Status  11/16/2023 29.2 27.0 - 33.0 pg Final   MCV  Date Value Ref Range Status  11/16/2023 89.3 80.0 - 100.0 fL Final   No results found for: PLTCOUNTKUC, LABPLAT, POCPLA RDW  Date Value Ref Range Status  11/16/2023 12.6 11.0 - 15.0 % Final

## 2024-02-25 ENCOUNTER — Other Ambulatory Visit: Payer: Self-pay | Admitting: Family Medicine

## 2024-02-25 DIAGNOSIS — E1169 Type 2 diabetes mellitus with other specified complication: Secondary | ICD-10-CM

## 2024-02-27 ENCOUNTER — Other Ambulatory Visit: Payer: Self-pay | Admitting: Family Medicine

## 2024-02-27 DIAGNOSIS — E1169 Type 2 diabetes mellitus with other specified complication: Secondary | ICD-10-CM

## 2024-02-27 NOTE — Telephone Encounter (Signed)
 Reordered 11/26/23 #360 3 RF-  Requested Prescriptions  Refused Prescriptions Disp Refills   metFORMIN  (GLUCOPHAGE -XR) 500 MG 24 hr tablet [Pharmacy Med Name: METFORMIN  ER 500MG  24HR TABS] 360 tablet 3    Sig: TAKE 2 TABLETS(1000 MG) BY MOUTH TWICE DAILY     Endocrinology:  Diabetes - Biguanides Failed - 02/27/2024 11:50 AM      Failed - HBA1C is between 0 and 7.9 and within 180 days    Hemoglobin A1C  Date Value Ref Range Status  09/29/2019 6.9  Final    Comment:    [facility]   Hgb A1c MFr Bld  Date Value Ref Range Status  11/16/2023 8.6 (H) <5.7 % of total Hgb Final    Comment:    For someone without known diabetes, a hemoglobin A1c value of 6.5% or greater indicates that they may have  diabetes and this should be confirmed with a follow-up  test. . For someone with known diabetes, a value <7% indicates  that their diabetes is well controlled and a value  greater than or equal to 7% indicates suboptimal  control. A1c targets should be individualized based on  duration of diabetes, age, comorbid conditions, and  other considerations. . Currently, no consensus exists regarding use of hemoglobin A1c for diagnosis of diabetes for children. .          Failed - eGFR in normal range and within 360 days    GFR, Est African American  Date Value Ref Range Status  04/05/2020 93 > OR = 60 mL/min/1.55m2 Final   GFR, Est Non African American  Date Value Ref Range Status  04/05/2020 80 > OR = 60 mL/min/1.10m2 Final   GFR, Estimated  Date Value Ref Range Status  02/23/2023 >60 >60 mL/min Final    Comment:    (NOTE) Calculated using the CKD-EPI Creatinine Equation (2021)    eGFR  Date Value Ref Range Status  06/20/2021 86 > OR = 60 mL/min/1.30m2 Final    Comment:    The eGFR is based on the CKD-EPI 2021 equation. To calculate  the new eGFR from a previous Creatinine or Cystatin C result, go to https://www.kidney.org/professionals/ kdoqi/gfr%5Fcalculator           Failed - B12 Level in normal range and within 720 days    No results found for: VITAMINB12       Passed - Cr in normal range and within 360 days    Creat  Date Value Ref Range Status  11/16/2023 0.94 0.70 - 1.22 mg/dL Final   Creatinine, Urine  Date Value Ref Range Status  11/16/2023 65 20 - 320 mg/dL Final         Passed - Valid encounter within last 6 months    Recent Outpatient Visits           1 month ago Centrilobular emphysema [facility])   [place] [facility] [place], [name], DO   3 months ago Annual physical exam   [place] [facility] [name], DO       Future Appointments             In 6 months [name], MD [facility] [place]            Passed - CBC within normal limits and completed in the last 12 months    WBC  Date Value Ref Range Status  11/16/2023 9.2 3.8 - 10.8 Thousand/uL Final   RBC  Date Value Ref Range Status  11/16/2023 4.39 4.20 - 5.80 Million/uL Final   Hemoglobin  Date Value Ref Range Status  11/16/2023 12.8 (L) 13.2 - 17.1 g/dL Final   HCT  Date Value Ref Range Status  11/16/2023 39.2 38.5 - 50.0 % Final   MCHC  Date Value Ref Range Status  11/16/2023 32.7 32.0 - 36.0 g/dL Final    Comment:    For adults, a slight decrease in the calculated MCHC value (in the range of 30 to 32 g/dL) is most likely not clinically significant; however, it should be interpreted with caution in correlation with other red cell parameters and the patient's clinical condition.    [facility]  Date Value Ref Range Status  11/16/2023 29.2 27.0 - 33.0 pg Final   MCV  Date Value Ref Range Status  11/16/2023 89.3 80.0 - 100.0 fL Final   No results found for: PLTCOUNTKUC, LABPLAT, POCPLA RDW  Date Value Ref Range Status  11/16/2023 12.6 11.0 - 15.0 % Final

## 2024-02-29 NOTE — Telephone Encounter (Signed)
 Requested Prescriptions  Pending Prescriptions Disp Refills   simvastatin  (ZOCOR ) 20 MG tablet [Pharmacy Med Name: SIMVASTATIN  20MG  TABLETS] 90 tablet 3    Sig: TAKE 1 TABLET(20 MG) BY MOUTH AT BEDTIME     Cardiovascular:  Antilipid - Statins Failed - 02/29/2024  5:02 PM      Failed - Lipid Panel in normal range within the last 12 months    Cholesterol  Date Value Ref Range Status  11/16/2023 128 <200 mg/dL Final   LDL Cholesterol (Calc)  Date Value Ref Range Status  11/16/2023 55 mg/dL (calc) Final    Comment:    Reference range: <100 . Desirable range <100 mg/dL for primary prevention;   <70 mg/dL for patients with CHD or diabetic patients  with > or = 2 CHD risk factors. SABRA LDL-C is now calculated using the Martin-Hopkins  calculation, which is a validated novel method providing  better accuracy than the Friedewald equation in the  estimation of LDL-C.  Gladis APPLETHWAITE et al. SANDREA. 7986;689(80): 2061-2068  (http://education.Quest[employer].com/faq/FAQ164)    HDL  Date Value Ref Range Status  11/16/2023 50 > OR = 40 mg/dL Final   Triglycerides  Date Value Ref Range Status  11/16/2023 152 (H) <150 mg/dL Final         Passed - Patient is not pregnant      Passed - Valid encounter within last 12 months    Recent Outpatient Visits           1 month ago Centrilobular emphysema [facility])   [place] [facility] [place], [name], DO   3 months ago Annual physical exam   [place] [facility] [name], DO       Future Appointments             In 6 months [name], MD [facility]

## 2024-03-07 ENCOUNTER — Other Ambulatory Visit: Admitting: Pharmacist

## 2024-03-07 ENCOUNTER — Encounter: Payer: Self-pay | Admitting: Pharmacist

## 2024-03-07 DIAGNOSIS — E1169 Type 2 diabetes mellitus with other specified complication: Secondary | ICD-10-CM

## 2024-03-07 DIAGNOSIS — Z7984 Long term (current) use of oral hypoglycemic drugs: Secondary | ICD-10-CM

## 2024-03-07 DIAGNOSIS — J432 Centrilobular emphysema: Secondary | ICD-10-CM

## 2024-03-07 NOTE — Progress Notes (Signed)
 03/07/2024 Name: Stephen Savage MRN: [id number] DOB: 03-21-1941  Chief Complaint  Patient presents with   Medication Assistance    Stephen Savage is a 83 y.o. year old male who presented for a telephone visit.   They were referred to the pharmacist by their PCP for assistance in managing diabetes and medication access.      Subjective:   Care Team: Primary Care Provider: [name], DO ; Next Scheduled Visit: 05/04/2024 Cardiologist: [name], MD  Urologist: [name], MD   Medication Access/Adherence  Current Pharmacy:  [facility] [phone] [address] [place], [place] - [address] AT [facility] [address] [place] [address] [place] [address] Phone: [phone] Fax: [phone]  [facility] #[id number] [name], [place] - [address] AT [facility] [address] [place] [address] Phone: [phone] Fax: [phone]   Patient reports affordability concerns with their medications: Yes  Patient reports access/transportation concerns to their pharmacy: No  Patient reports adherence concerns with their medications:  No     Barriers: reports limited literacy   Per review of notes in chart from CPhT team - patient approved for patient assistance from [employer] for Bevespi inhaler from 02/23/2024 to 08/17/2024  - CPhT submitted application for Januvia  patient assistance to [employer] on 02/23/2024    Diabetes:   Current medications:  - metformin  ER 500 mg - 2 tablets twice daily   Medications tried in the past: Januvia  (cost), Rybelsus  (cost)   Current glucose readings: reports morning fasting blood sugar typically ranging 135-140   Patient denies hypoglycemic s/sx including dizziness, shakiness, sweating.    Reports limits portion sizes of carbohydrates and sweets   Current Medication Access Support: collaborating with PCP and CPhT to apply for patient assistance for Januvia  from [employer]  find patient received attestation letter but set it aside as understood it to mean that he was denied.     COPD:   Current medications: - albuterol  inhaler - 2 puffs every 6 hours as needed for wheezing or shortness of breath   Medications tried in the past: Symbicort  (cost)  Current Medication Access Support: approved for patient assistance from [employer] for Bevespi inhaler from 02/23/2024 to 08/17/2024  - Reports has not yet received shipment of Bevespi inhalers from program, but will watch for supply in mail  Objective:  Lab Results  Component Value Date   HGBA1C 8.6 (H) 11/16/2023    Lab Results  Component Value Date   CREATININE 0.94 11/16/2023   BUN 15 11/16/2023   NA 136 11/16/2023   K 4.2 11/16/2023   CL 97 (L) 11/16/2023   CO2 28 11/16/2023    Lab Results  Component Value Date   CHOL 128 11/16/2023   HDL 50 11/16/2023   LDLCALC 55 11/16/2023   TRIG 152 (H) 11/16/2023   CHOLHDL 2.6 11/16/2023    Current Outpatient Medications on File Prior to Visit  Medication Sig Dispense Refill   albuterol  (VENTOLIN  HFA) 108 (90 Base) MCG/ACT inhaler Inhale 2 puffs into the lungs every 6 (six) hours as needed for wheezing or shortness of breath. 18 g 3   amLODipine  (NORVASC ) 10 MG tablet Take 1 tablet (10 mg total) by mouth daily. 90 tablet 3   aspirin EC 81 MG tablet Take 81 mg by mouth daily. Swallow whole.     glucose blood (ONETOUCH ULTRA) test strip Use to check blood  sugar twice daily 100 strip 5   lisinopril  (ZESTRIL ) 40 MG tablet Take 1 tablet (40 mg total) by mouth daily. 90 tablet 3   metFORMIN  (GLUCOPHAGE -XR) 500 MG 24 hr tablet Take 2 tablets (1,000 mg total) by mouth 2 (two) times daily with a meal. 360 tablet 3   omeprazole  (PRILOSEC) 20 MG capsule Take 1 capsule (20 mg total) by mouth daily before breakfast. 90 capsule 3   ondansetron  (ZOFRAN -ODT) 4 MG disintegrating tablet Take 1 tablet (4 mg total) by mouth every 8 (eight) hours as needed for nausea or vomiting.  15 tablet 0   simvastatin  (ZOCOR ) 20 MG tablet Take 1 tablet (20 mg total) by mouth at bedtime. 90 tablet 3   SYMBICORT  80-4.5 MCG/ACT inhaler Inhale 2 puffs into the lungs 2 (two) times daily. (Patient not taking: Reported on 02/05/2024) 10.2 g 11   traZODone  (DESYREL ) 50 MG tablet Take 1 tablet (50 mg total) by mouth at bedtime as needed for sleep. 30 tablet 3   No current facility-administered medications on file prior to visit.       Assessment/Plan:   COPD: - Reviewed appropriate inhaler technique. - Patient to contact [employer] patient assistance program as needed for refills of Bevespi inhaler   Diabetes: - Currently uncontrolled - Reviewed long term cardiovascular and renal outcomes of uncontrolled blood sugar - Reviewed goal A1c, goal fasting, and goal 2 hour post prandial glucose - Reviewed dietary modifications including importance of having regular well-balanced meals and snacks throughout the day, while controlling carbohydrate portion sizes             Encourage patient to increase consumption of non-starchy vegetables - Plan is to start Januvia  100 mg daily when received from assistance program - Patient to contact CPhT Suzen Mall at [phone] with his daughter in law for support with completing the [employer] Patient Assistance Program attestation letter that he received in the mail - Recommend to check glucose, keep log of results and have this record to review at upcoming medical appointments. Patient to contact provider office sooner if needed for readings outside of established parameters or symptoms     Follow Up Plan: Clinical Pharmacist will follow up with patient by telephone next month   Sharyle Sia, PharmD, JAQUELINE, CPP Clinical Pharmacist [facility] [phone]

## 2024-03-07 NOTE — Patient Instructions (Signed)
 As we discussed today, please follow up with Pharmacy Technician Suzen Mall at [phone] for assistance with filling out the form that you received from [employer] patient assistance program in order to receive your Januvia .   When you receive your Bevespi inhaler in the mail, please start using this maintenance inhaler 2 puffs every morning and 2 puffs every evening.   If you need to reach out to patient assistance programs regarding refills or to find out the status of your application, you can do so by calling:   For Bevespi: Call [employer] at [phone] For Januvia : Call [employer] [phone]   Thank you!   Sharyle Sia, PharmD, JAQUELINE, CPP Clinical Pharmacist [facility] [phone]

## 2024-03-08 NOTE — Telephone Encounter (Signed)
 Reached out to Daughter and she will check to see if patient received the attestation letter and returned it to [employer].  She will let me know if another letter needs to be sent again to patient.

## 2024-03-09 NOTE — Telephone Encounter (Signed)
 Reached out to caregiver and she had attestation letter signed and will mail out to [employer] today.

## 2024-03-22 NOTE — Telephone Encounter (Signed)
 PAP: Patient assistance application for Januvia  has been approved by PAP Companies: [employer] from 03/15/2024 to 08/17/2024. Medication should be delivered to PAP Delivery: Home. For further shipping updates, please contact [employer] at [phone]. Patient ID is: not provided. Medication was delivered on 03/18/24

## 2024-03-28 DIAGNOSIS — H5203 Hypermetropia, bilateral: Secondary | ICD-10-CM | POA: Diagnosis not present

## 2024-03-28 DIAGNOSIS — H4321 Crystalline deposits in vitreous body, right eye: Secondary | ICD-10-CM | POA: Diagnosis not present

## 2024-03-28 DIAGNOSIS — H524 Presbyopia: Secondary | ICD-10-CM | POA: Diagnosis not present

## 2024-03-28 DIAGNOSIS — H52223 Regular astigmatism, bilateral: Secondary | ICD-10-CM | POA: Diagnosis not present

## 2024-03-28 DIAGNOSIS — H2513 Age-related nuclear cataract, bilateral: Secondary | ICD-10-CM | POA: Diagnosis not present

## 2024-04-01 ENCOUNTER — Telehealth: Payer: Self-pay | Admitting: Pharmacist

## 2024-04-01 NOTE — Progress Notes (Signed)
   Outreach Note  04/01/2024 Name: Stephen Savage MRN: [id number] DOB: [birth year]  Referred by: [name], DO  Receive message from CPhT Suzen Mall advising patient APPROVED for patient assistance for Januvia  from [employer] as of 03/15/2024 to 08/17/2024.  No refills placed on original Rx; new prescription will be needed for further refills.  Attempt to reach patient today. Was unable to reach patient via telephone and unable to leave a message.   Follow Up Plan: Will attempt to reach patient by telephone again within the next 14 days  Sharyle Sia, PharmD, JAQUELINE, CPP Clinical Pharmacist [facility] [phone]

## 2024-04-04 ENCOUNTER — Other Ambulatory Visit

## 2024-04-04 ENCOUNTER — Other Ambulatory Visit: Payer: Self-pay

## 2024-04-04 DIAGNOSIS — E1169 Type 2 diabetes mellitus with other specified complication: Secondary | ICD-10-CM

## 2024-04-04 DIAGNOSIS — J432 Centrilobular emphysema: Secondary | ICD-10-CM

## 2024-04-04 DIAGNOSIS — Z7984 Long term (current) use of oral hypoglycemic drugs: Secondary | ICD-10-CM

## 2024-04-04 MED ORDER — SITAGLIPTIN PHOSPHATE 100 MG PO TABS: 100.0000 mg | ORAL_TABLET | Freq: Every day | ORAL | 0 refills | Status: DC

## 2024-04-04 MED ORDER — ONETOUCH ULTRA VI STRP: ORAL_STRIP | 5 refills | Status: DC

## 2024-04-04 NOTE — Progress Notes (Signed)
 04/04/2024 Name: Stephen Savage MRN: [id number] DOB: 1941-07-17  Chief Complaint  Patient presents with   Medication Management   Medication Assistance    Stephen Savage is a 83 y.o. year old male who presented for a telephone visit.   They were referred to the pharmacist by their PCP for assistance in managing diabetes and medication access.      Subjective:   Care Team: Primary Care Provider: [name], DO ; Next Scheduled Visit: 05/04/2024 Cardiologist: [name], MD  Urologist: [name], MD   Medication Access/Adherence  Current Pharmacy:  [facility] [phone] [address], [place] - [address] AT [facility] [address] [place] [address] [place] [address] Phone: [phone] Fax: [phone]  [facility] DR[facility]ORE #[id number] [name], [place] - [address] AT [facility] [address] [place] [address] Phone: [phone] Fax: [phone]   Patient reports affordability concerns with their medications: No Patient reports access/transportation concerns to their pharmacy: No  Patient reports adherence concerns with their medications:  No     Barriers: reports limited literacy     Diabetes:   Current medications:  - metformin  ER 500 mg - 2 tablets twice daily - Januvia  100 mg daily  Reports tolerating well   Medications tried in the past: Januvia  (cost), Rybelsus  (cost)   Current glucose readings: reports blood sugar readings ranging 87-185   Patient denies hypoglycemic s/sx including dizziness, shakiness, sweating.    Reports limits portion sizes of carbohydrates and sweets   Current Medication Access Support: Enrolled in patient assistance for Januvia  from [employer] 03/15/2024 to 08/17/2024   Note no refills placed on original Rx; new prescription will be needed for further refills.      COPD:   Current medications: - Bevespi - 2 puffs twice daily - albuterol  inhaler - 2  puffs every 6 hours as needed for wheezing or shortness of breath   Medications tried in the past: Symbicort  (cost)   Reports only occasionally needing albuterol  inhaler now that he is using Bevespi maintenance inhaler  Current Medication Access Support: approved for patient assistance from [employer] for Bevespi inhaler from 02/23/2024 to 08/17/2024    Objective:  Lab Results  Component Value Date   HGBA1C 8.6 (H) 11/16/2023    Lab Results  Component Value Date   CREATININE 0.94 11/16/2023   BUN 15 11/16/2023   NA 136 11/16/2023   K 4.2 11/16/2023   CL 97 (L) 11/16/2023   CO2 28 11/16/2023    Lab Results  Component Value Date   CHOL 128 11/16/2023   HDL 50 11/16/2023   LDLCALC 55 11/16/2023   TRIG 152 (H) 11/16/2023   CHOLHDL 2.6 11/16/2023    Medications Reviewed Today     Reviewed by Alana Sharyle LABOR, RPH-CPP (Pharmacist) on 04/04/24 at 1443  Med List Status: <None>   Medication Order Taking? Sig Documenting Provider Last Dose Status Informant  albuterol  (VENTOLIN  HFA) 108 (90 Base) MCG/ACT inhaler [id number]  Inhale 2 puffs into the lungs every 6 (six) hours as needed for wheezing or shortness of breath. [name], DO  Active   amLODipine  (NORVASC ) 10 MG tablet [id number]  Take 1 tablet (10 mg total) by mouth daily. [name], DO  Active   aspirin EC 81 MG tablet [id number]  Take 81 mg by mouth daily. Swallow whole. [provider]  Active   glucose  blood (ONETOUCH ULTRA) test strip [id number]  Use to check blood sugar twice daily Karamalegos, [name], DO  Active   Glycopyrrolate-Formoterol (BEVESPI AEROSPHERE) 9-4.8 MCG/ACT TERESE [id number] Yes Inhale 2 puffs into the lungs 2 (two) times daily. [provider]  Active   lisinopril  (ZESTRIL ) 40 MG tablet [id number]  Take 1 tablet (40 mg total) by mouth daily. Karamalegos, [name], DO  Active   metFORMIN  (GLUCOPHAGE -XR) 500 MG 24 hr tablet [id number] Yes Take 2 tablets  (1,000 mg total) by mouth 2 (two) times daily with a meal. Karamalegos, [name], DO  Active   omeprazole  (PRILOSEC) 20 MG capsule [id number]  Take 1 capsule (20 mg total) by mouth daily before breakfast. [name], DO  Active   ondansetron  (ZOFRAN -ODT) 4 MG disintegrating tablet [id number]  Take 1 tablet (4 mg total) by mouth every 8 (eight) hours as needed for nausea or vomiting. Menshew, Candida LULLA Kings, PA-C  Active   simvastatin  (ZOCOR ) 20 MG tablet [id number]  Take 1 tablet (20 mg total) by mouth at bedtime. [name], DO  Active   sitaGLIPtin  (JANUVIA ) 100 MG tablet [id number] Yes Take 100 mg by mouth daily. [provider]  Active    Patient not taking:   Discontinued 04/04/24 1442 (Change in therapy)   traZODone  (DESYREL ) 50 MG tablet [id number]  Take 1 tablet (50 mg total) by mouth at bedtime as needed for sleep. [name], DO  Active               Assessment/Plan:   COPD: - Reviewed appropriate inhaler technique. - Patient to contact [employer] patient assistance program as needed for refills of Bevespi inhaler   Diabetes: - Currently uncontrolled - Reviewed long term cardiovascular and renal outcomes of uncontrolled blood sugar - Reviewed goal A1c, goal fasting, and goal 2 hour post prandial glucose - Reviewed dietary modifications including importance of having regular well-balanced meals and snacks throughout the day, while controlling carbohydrate portion sizes             Encourage patient to increase consumption of non-starchy vegetables - CPP will send renewal for Januvia  100 mg daily to [employer] patient assistance program for patient - Patient to follow up with [employer] Patient Assistance Program as needed for refill of Januvia   - Recommend to check glucose, keep log of results and have this record to review at upcoming medical appointments. Patient to contact provider office sooner if needed for readings outside of established  parameters or symptoms     Follow Up Plan: Clinical Pharmacist will follow up with patient by telephone on 06/06/2024 at 1:30 PM    Sharyle Sia, PharmD, JAQUELINE, CPP Clinical Pharmacist [facility] [phone]

## 2024-04-04 NOTE — Patient Instructions (Signed)
 Goals Addressed             This Visit's Progress    Pharmacy Goals       The goal A1c is less than 7%. This is the best way to reduce the risk of the long term complications of diabetes, including heart disease, kidney disease, eye disease, strokes, and nerve damage. An A1c of less than 7% corresponds with fasting sugars less than 130 and 2 hour after meal sugars less than 180.   When you need to reach out to patient assistance programs regarding refills or to find out the status of your application, you can do so by calling:   For Bevespi: Call [employer] at [phone] For Januvia : Call [employer] [phone]   Thank you!   Sharyle Sia, PharmD, JAQUELINE, CPP Clinical Pharmacist [facility] [phone]  Thank you!  Sharyle Sia, PharmD, JAQUELINE, CPP Clinical Pharmacist [facility] [phone]

## 2024-04-12 ENCOUNTER — Encounter: Payer: Self-pay | Admitting: Urology

## 2024-04-14 NOTE — Addendum Note (Signed)
 Addended byBETHA CORIE PLATER on: 04/14/2024 04:03 PM   Modules accepted: Orders

## 2024-04-19 ENCOUNTER — Telehealth: Payer: Self-pay

## 2024-04-19 DIAGNOSIS — E1169 Type 2 diabetes mellitus with other specified complication: Secondary | ICD-10-CM

## 2024-04-19 MED ORDER — ACCU-CHEK SOFTCLIX LANCETS MISC: 3 refills | Status: AC

## 2024-04-19 MED ORDER — ACCU-CHEK GUIDE TEST VI STRP: ORAL_STRIP | 3 refills | Status: AC

## 2024-04-19 MED ORDER — ACCU-CHEK GUIDE ME W/DEVICE KIT: PACK | 0 refills | Status: AC

## 2024-04-19 NOTE — Addendum Note (Signed)
 Addended by: [name] on: 04/19/2024 12:14 PM   Modules accepted: Orders

## 2024-04-19 NOTE — Telephone Encounter (Signed)
 One touch glucose monitor not covered by insurance. Ok to order the accu check monitor?

## 2024-05-04 ENCOUNTER — Encounter: Payer: Self-pay | Admitting: Family Medicine

## 2024-05-04 ENCOUNTER — Ambulatory Visit (INDEPENDENT_AMBULATORY_CARE_PROVIDER_SITE_OTHER): Admitting: Family Medicine

## 2024-05-04 ENCOUNTER — Other Ambulatory Visit: Payer: Self-pay | Admitting: Family Medicine

## 2024-05-04 VITALS — BP 138/62 | HR 83 | Ht 69.0 in | Wt 124.5 lb

## 2024-05-04 DIAGNOSIS — Z0000 Encounter for general adult medical examination without abnormal findings: Secondary | ICD-10-CM

## 2024-05-04 DIAGNOSIS — R351 Nocturia: Secondary | ICD-10-CM

## 2024-05-04 DIAGNOSIS — F5104 Psychophysiologic insomnia: Secondary | ICD-10-CM | POA: Diagnosis not present

## 2024-05-04 DIAGNOSIS — Z7984 Long term (current) use of oral hypoglycemic drugs: Secondary | ICD-10-CM | POA: Diagnosis not present

## 2024-05-04 DIAGNOSIS — I10 Essential (primary) hypertension: Secondary | ICD-10-CM

## 2024-05-04 DIAGNOSIS — I2510 Atherosclerotic heart disease of native coronary artery without angina pectoris: Secondary | ICD-10-CM

## 2024-05-04 DIAGNOSIS — E1169 Type 2 diabetes mellitus with other specified complication: Secondary | ICD-10-CM | POA: Diagnosis not present

## 2024-05-04 DIAGNOSIS — Z23 Encounter for immunization: Secondary | ICD-10-CM | POA: Diagnosis not present

## 2024-05-04 DIAGNOSIS — J432 Centrilobular emphysema: Secondary | ICD-10-CM

## 2024-05-04 DIAGNOSIS — Z125 Encounter for screening for malignant neoplasm of prostate: Secondary | ICD-10-CM

## 2024-05-04 LAB — POCT GLYCOSYLATED HEMOGLOBIN (HGB A1C): Hemoglobin A1C: 6.5 % — AB (ref 4.0–5.6)

## 2024-05-04 MED ORDER — AMLODIPINE BESYLATE 10 MG PO TABS: 10.0000 mg | ORAL_TABLET | Freq: Every day | ORAL | 3 refills | Status: AC

## 2024-05-04 NOTE — Patient Instructions (Addendum)
 Thank you for coming to the office today.  Recent Labs    05/18/23 0955 11/16/23 0832 05/04/24 0926  HGBA1C 7.1* 8.6* 6.5*   Refilled Amlodipine  to your pharmacy.  If you want to do the shoulder injection come back sooner  Flu Shot today  Please schedule a Follow-up Appointment to: Return for 6 month fasting lab > 1 week later Annual Physical.  If you have any other questions or concerns, please feel free to call the office or send a message through MyChart. You may also schedule an earlier appointment if necessary.  Additionally, you may be receiving a survey about your experience at our office within a few days to 1 week by e-mail or mail. We value your feedback.  Marsa Officer, DO [facility], [place]

## 2024-05-04 NOTE — Progress Notes (Signed)
 Subjective:    Patient ID: Stephen Savage, male    DOB: May 30, 1941, 83 y.o.   MRN: [id number]  Stephen Savage is a 83 y.o. male presenting on 05/04/2024 for Medical Management of Chronic Issues   HPI  Discussed the use of AI scribe software for clinical note transcription with the patient, who gave verbal consent to proceed.  History of Present Illness   Stephen Savage is an 83 year old male with diabetes who presents for medication management and follow-up on diabetes control.   Sleep disturbance - No use of sleeping pills - Previously prescribed trazodone  one year ago, no longer using - Currently uses Tylenol  for sleep  Centrilobular Emphysema Tobacco Abuse Chronic problem, active smoker 1ppd >60 years. Not ready to quit. He has some occasional breathing problem dyspnea or cough. On Bevespi 2 puff TWICE A DAY now, no longer covered symbicort  He has reduced some smoking   CHRONIC HTN: Reports no concerns, has controlled BP Current Meds - Lisinopril  40mg  daily, Amlodipine  10mg  daily,    Reports good compliance, took meds today. Tolerating well, w/o complaints. Denies CP, dyspnea, HA, edema, dizziness / lightheadedness   CHRONIC DM, Type 2: A1c improved to 6.5 now CBGs: checking 1-2 times per day Meds: Januvia  100mg  daily, Metformin  XR 500mg  x 2 = 1000mg  TWICE A DAY Reports good compliance. Tolerating well w/o side-effects Currently on ACEi Controlled on Statin Lifestyle: - Diet (Improved)  - Exercise (Active) DM Eye Exam [facility] due this year 2025 Denies hypoglycemia, polyuria, visual changes, numbness or tingling.      Health Maintenance: Flu Shot today     11/14/2022   10:19 AM 07/16/2022    9:35 AM 06/18/2021    1:59 PM  Depression screen PHQ 2/9  Decreased Interest 0 0 0  Down, Depressed, Hopeless 0 0 0  PHQ - 2 Score 0 0 0  Altered sleeping 0    Tired, decreased energy 1    Change in appetite 0    Feeling bad or failure about yourself   0    Trouble concentrating 0    Moving slowly or fidgety/restless 0    Suicidal thoughts 0    PHQ-9 Score 1    Difficult doing work/chores Not difficult at all         11/14/2022   10:19 AM  GAD 7 : Generalized Anxiety Score  Nervous, Anxious, on Edge 0  Control/stop worrying 0  Worry too much - different things 0  Trouble relaxing 0  Restless 0  Easily annoyed or irritable 0  Afraid - awful might happen 0  Total GAD 7 Score 0  Anxiety Difficulty Not difficult at all    Social History   Tobacco Use   Smoking status: Every Day    Current packs/day: 1.00    Average packs/day: 1 pack/day for 60.0 years (60.0 ttl pk-yrs)    Types: Cigarettes   Smokeless tobacco: Current    Types: Chew  Vaping Use   Vaping status: Former  Substance Use Topics   Alcohol use: Not Currently   Drug use: Never    Review of Systems Per HPI unless specifically indicated above     Objective:    BP 138/62 (BP Location: Left Arm, Cuff Size: Normal)   Pulse 83   Ht 5' 9 (1.753 m)   Wt 124 lb 8 oz (56.5 kg)   SpO2 94%   BMI 18.39 kg/m   Wt Readings from Last 3  Encounters:  05/04/24 124 lb 8 oz (56.5 kg)  01/27/24 128 lb (58.1 kg)  11/26/23 130 lb (59 kg)    Physical Exam Vitals and nursing note reviewed.  Constitutional:      General: He is not in acute distress.    Appearance: He is well-developed. He is not diaphoretic.     Comments: Well-appearing, comfortable, cooperative  HENT:     Head: Normocephalic and atraumatic.  Eyes:     General:        Right eye: No discharge.        Left eye: No discharge.     Conjunctiva/sclera: Conjunctivae normal.  Neck:     Thyroid: No thyromegaly.  Cardiovascular:     Rate and Rhythm: Normal rate and regular rhythm.     Pulses: Normal pulses.     Heart sounds: Normal heart sounds. No murmur heard. Pulmonary:     Effort: Pulmonary effort is normal. No respiratory distress.     Breath sounds: Normal breath sounds. No wheezing or rales.   Musculoskeletal:        General: Normal range of motion.     Cervical back: Normal range of motion and neck supple.  Lymphadenopathy:     Cervical: No cervical adenopathy.  Skin:    General: Skin is warm and dry.     Findings: No erythema or rash.  Neurological:     Mental Status: He is alert and oriented to person, place, and time. Mental status is at baseline.  Psychiatric:        Behavior: Behavior normal.     Comments: Well groomed, good eye contact, normal speech and thoughts     Results for orders placed or performed in visit on 05/04/24  POCT HgB A1C   Collection Time: 05/04/24  9:26 AM  Result Value Ref Range   Hemoglobin A1C 6.5 (A) 4.0 - 5.6 %   HbA1c POC (<> result, manual entry)     HbA1c, POC (prediabetic range)     HbA1c, POC (controlled diabetic range)        Assessment & Plan:   Problem List Items Addressed This Visit     Essential hypertension   Relevant Medications   amLODipine  (NORVASC ) 10 MG tablet   Type 2 diabetes mellitus with other specified complication (HCC) - Primary   Relevant Orders   POCT HgB A1C (Completed)   Other Visit Diagnoses       Flu vaccine need       Relevant Orders   Flu vaccine HIGH DOSE PF(Fluzone Trivalent) (Completed)     Psychophysiological insomnia         Long term current use of oral hypoglycemic drug            Type 2 diabetes mellitus Well-controlled with A1c of 6.5%. - Continue metformin  and Januvia . - Schedule blood work in six months.  Essential hypertension Managed with two antihypertensive medications. Amlodipine , Lisinopril  - Refill blood pressure medication at [facility].  Emphysema Chronic obstructive pulmonary disease (COPD) Working with clinical pharmacy on coverage Symptoms managed with Bevespi inhaler. - Continue Bevespi inhaler as prescribed.        Orders Placed This Encounter  Procedures   Flu vaccine HIGH DOSE PF(Fluzone Trivalent)   POCT HgB A1C    Meds ordered this encounter   Medications   amLODipine  (NORVASC ) 10 MG tablet    Sig: Take 1 tablet (10 mg total) by mouth daily.    Dispense:  90 tablet  Refill:  3    Follow up plan: Return for 6 month fasting lab > 1 week later Annual Physical.  Future 11/04/23 labs  Marsa Officer, DO [facility] 05/04/2024, 9:24 AM

## 2024-05-27 ENCOUNTER — Other Ambulatory Visit: Payer: Self-pay | Admitting: Family Medicine

## 2024-05-27 DIAGNOSIS — E1169 Type 2 diabetes mellitus with other specified complication: Secondary | ICD-10-CM

## 2024-05-30 ENCOUNTER — Other Ambulatory Visit: Payer: Self-pay

## 2024-05-30 ENCOUNTER — Ambulatory Visit: Admitting: Family Medicine

## 2024-05-30 DIAGNOSIS — E1169 Type 2 diabetes mellitus with other specified complication: Secondary | ICD-10-CM

## 2024-05-30 MED ORDER — SIMVASTATIN 20 MG PO TABS: 20.0000 mg | ORAL_TABLET | Freq: Every day | ORAL | 3 refills | Status: AC

## 2024-05-30 NOTE — Telephone Encounter (Signed)
 Duplicate request, refilled 05/30/24.  Requested Prescriptions  Pending Prescriptions Disp Refills   simvastatin  (ZOCOR ) 20 MG tablet [Pharmacy Med Name: SIMVASTATIN  20MG  TABLETS] 90 tablet 3    Sig: TAKE 1 TABLET(20 MG) BY MOUTH AT BEDTIME     Cardiovascular:  Antilipid - Statins Failed - 05/30/2024  4:22 PM      Failed - Lipid Panel in normal range within the last 12 months    Cholesterol  Date Value Ref Range Status  11/16/2023 128 <200 mg/dL Final   LDL Cholesterol (Calc)  Date Value Ref Range Status  11/16/2023 55 mg/dL (calc) Final    Comment:    Reference range: <100 . Desirable range <100 mg/dL for primary prevention;   <70 mg/dL for patients with CHD or diabetic patients  with > or = 2 CHD risk factors. SABRA LDL-C is now calculated using the Martin-Hopkins  calculation, which is a validated novel method providing  better accuracy than the Friedewald equation in the  estimation of LDL-C.  Gladis APPLETHWAITE et al. SANDREA. 7986;689(80): 2061-2068  (http://education.QuestDiagnostics.com/faq/FAQ164)    HDL  Date Value Ref Range Status  11/16/2023 50 > OR = 40 mg/dL Final   Triglycerides  Date Value Ref Range Status  11/16/2023 152 (H) <150 mg/dL Final         Passed - Patient is not pregnant      Passed - Valid encounter within last 12 months    Recent Outpatient Visits           3 weeks ago Type 2 diabetes mellitus with other specified complication, without long-term current use of insulin [facility])   [place] [facility] [place], [name], DO   4 months ago Centrilobular emphysema [facility])   [place] [facility] [name], DO   6 months ago Annual physical exam   [place] [facility] [name], DO       Future Appointments             In 3 months [place], Penne SAUNDERS, MD [facility] [place] [facility]

## 2024-06-02 ENCOUNTER — Other Ambulatory Visit: Payer: Self-pay

## 2024-06-02 DIAGNOSIS — E1169 Type 2 diabetes mellitus with other specified complication: Secondary | ICD-10-CM

## 2024-06-02 MED ORDER — SITAGLIPTIN PHOSPHATE 100 MG PO TABS: 100.0000 mg | ORAL_TABLET | Freq: Every day | ORAL | 0 refills | Status: DC

## 2024-06-06 ENCOUNTER — Other Ambulatory Visit: Admitting: Pharmacist

## 2024-06-06 DIAGNOSIS — Z7984 Long term (current) use of oral hypoglycemic drugs: Secondary | ICD-10-CM

## 2024-06-06 DIAGNOSIS — J432 Centrilobular emphysema: Secondary | ICD-10-CM

## 2024-06-06 DIAGNOSIS — E1169 Type 2 diabetes mellitus with other specified complication: Secondary | ICD-10-CM

## 2024-06-06 NOTE — Progress Notes (Unsigned)
 06/06/2024 Name: Stephen Savage MRN: [id number] DOB: Nov 24, 1940  Chief Complaint  Patient presents with   Medication Management   Medication Assistance    Stephen Savage is a 83 y.o. year old male who presented for a telephone visit.   They were referred to the pharmacist by their PCP for assistance in managing diabetes and medication access.    Also attempt to reach patient's daughter, Stephen Savage (listed on DPR), as requested by patient. Leave a message for daughter requesting a call back   Subjective:   Care Team: Primary Care Provider: [name], DO ; Next Scheduled Visit: 11/14/2024 Cardiologist: Stephen Rogue, MD  Urologist: Stephen Knee, MD   Medication Access/Adherence  Current Pharmacy:  [facility] DR[facility] STORE [phone] [address], [place] - [address] AT [facility] OF [address] [place] [address] [place] [address] Phone: [phone] Fax: [phone]  [facility] DR[facility] STORE #[id number] [name], [place] - [address] AT [facility] OF [address] [place] [address] Phone: [phone] Fax: [phone]  [facility] [place] - [address] [place] [address] Phone: [phone] Fax: [phone]   Patient reports affordability concerns with their medications: No Patient reports access/transportation concerns to their pharmacy: No  Patient reports adherence concerns with their medications:  No     Barriers: reports limited literacy     Diabetes:   Current medications:  - metformin  ER 500 mg - 2 tablets twice daily - Januvia  100 mg daily             Reports tolerating well   Medications tried in the past: Januvia  (cost), Rybelsus  (cost)   Current glucose readings: morning fasting blood sugar readings ranging in 120s   Patient denies hypoglycemic s/sx including dizziness, shakiness, sweating.    Reports limits portion sizes of carbohydrates and sweets    Current Medication Access Support: Enrolled in patient assistance for Januvia  from [employer] 03/15/2024 to 08/17/2024      COPD:   Current medications: - Bevespi - 2 puffs twice daily - albuterol  inhaler - 2 puffs every 6 hours as needed for wheezing or shortness of breath   Medications tried in the past: Symbicort  (cost)   Reports only occasionally needing albuterol  inhaler now that he is using Bevespi maintenance inhaler   Current Medication Access Support: Enrolled in patient assistance from [employer] for Bevespi inhaler through 08/17/2024  - Report that they have already started the [employer] Digital Assistant for re-enrollment. Step-daughter Stephen Savage plans to help patient with final part of this re-enrollment for 2026  Objective:  Lab Results  Component Value Date   HGBA1C 6.5 (A) 05/04/2024    Lab Results  Component Value Date   CREATININE 0.94 11/16/2023   BUN 15 11/16/2023   NA 136 11/16/2023   K 4.2 11/16/2023   CL 97 (L) 11/16/2023   CO2 28 11/16/2023    Lab Results  Component Value Date   CHOL 128 11/16/2023   HDL 50 11/16/2023   LDLCALC 55 11/16/2023   TRIG 152 (H) 11/16/2023   CHOLHDL 2.6 11/16/2023    Current Outpatient Medications on File Prior to Visit  Medication Sig Dispense Refill   albuterol  (VENTOLIN  HFA) 108 (90 Base) MCG/ACT inhaler Inhale 2 puffs into the lungs every 6 (six) hours as needed for wheezing or shortness of breath. 18 g 3   Glycopyrrolate-Formoterol (BEVESPI AEROSPHERE) 9-4.8 MCG/ACT AERO Inhale 2 puffs  into the lungs 2 (two) times daily.     metFORMIN  (GLUCOPHAGE -XR) 500 MG 24 hr tablet Take 2 tablets (1,000 mg total) by mouth 2 (two) times daily with a meal. 360 tablet 3   sitaGLIPtin  (JANUVIA ) 100 MG tablet Take 1 tablet (100 mg total) by mouth daily. 90 tablet 0   ACCU-CHEK GUIDE TEST test strip Use to check blood sugar up to 2 x daily 200 each 3   Accu-Chek Softclix Lancets lancets Use to check blood sugar up to 2 x daily 200 each 3    amLODipine  (NORVASC ) 10 MG tablet Take 1 tablet (10 mg total) by mouth daily. 90 tablet 3   aspirin EC 81 MG tablet Take 81 mg by mouth daily. Swallow whole.     Blood Glucose Monitoring Suppl (ACCU-CHEK GUIDE ME) w/Device KIT Use to check blood sugar up to 2 x per day 1 kit 0   lisinopril  (ZESTRIL ) 40 MG tablet Take 1 tablet (40 mg total) by mouth daily. 90 tablet 3   omeprazole  (PRILOSEC) 20 MG capsule Take 1 capsule (20 mg total) by mouth daily before breakfast. 90 capsule 3   ondansetron  (ZOFRAN -ODT) 4 MG disintegrating tablet Take 1 tablet (4 mg total) by mouth every 8 (eight) hours as needed for nausea or vomiting. 15 tablet 0   simvastatin  (ZOCOR ) 20 MG tablet Take 1 tablet (20 mg total) by mouth at bedtime. 90 tablet 3   No current facility-administered medications on file prior to visit.       Assessment/Plan:   COPD: - Reviewed appropriate inhaler technique. - Patient to contact [employer] patient assistance program as needed for refills of Bevespi inhaler - Patient and family to complete re-enrollment for 2026 via [employer] Digital Assistant   Diabetes: - Currently controlled - Reviewed dietary modifications including importance of having regular well-balanced meals and snacks throughout the day, while controlling carbohydrate portion sizes - Patient to follow up with [employer] Patient Assistance Program as needed for refill of Januvia   - Recommend to check glucose, keep log of results and have this record to review at upcoming medical appointments. Patient to contact provider office sooner if needed for readings outside of established parameters or symptoms     Follow Up Plan: Clinical Pharmacist will follow up with patient by telephone on 07/18/2024 at 1:30 PM    Sharyle Sia, PharmD, JAQUELINE, CPP Clinical Pharmacist [facility] [phone]

## 2024-06-07 ENCOUNTER — Encounter: Payer: Self-pay | Admitting: Pharmacist

## 2024-06-08 NOTE — Patient Instructions (Signed)
 Please continue to follow up with [employer] to complete re-enrollment for patient assistance through their Writer for your Bevespi inhaler! If you need to reach [employer] directly, you can do so by calling [phone].   I look forward to speaking with you again by telephone on 07/18/2024 at 1:30 PM to start re-enrollment for your Januvia  patient assistance from [employer].   In the meantime, if you need refills of your Januvia , you can contact [employer] at [phone].   Thank you!   Sharyle Sia, PharmD, JAQUELINE, CPP Clinical Pharmacist [facility] [phone]

## 2024-06-27 ENCOUNTER — Telehealth: Payer: Self-pay

## 2024-06-27 NOTE — Telephone Encounter (Signed)
 PAP: Patient assistance application for Bevespi through [employer] ([employer]) has been mailed to pt's home address on file. Provider portion of application will be faxed to provider's office.

## 2024-07-18 ENCOUNTER — Telehealth: Payer: Self-pay

## 2024-07-18 ENCOUNTER — Encounter: Payer: Self-pay | Admitting: Pharmacist

## 2024-07-18 ENCOUNTER — Other Ambulatory Visit: Admitting: Pharmacist

## 2024-07-18 DIAGNOSIS — E1169 Type 2 diabetes mellitus with other specified complication: Secondary | ICD-10-CM

## 2024-07-18 DIAGNOSIS — Z7984 Long term (current) use of oral hypoglycemic drugs: Secondary | ICD-10-CM

## 2024-07-18 DIAGNOSIS — J432 Centrilobular emphysema: Secondary | ICD-10-CM

## 2024-07-18 MED ORDER — BEVESPI AEROSPHERE 9-4.8 MCG/ACT IN AERO: 2.0000 | INHALATION_SPRAY | Freq: Two times a day (BID) | RESPIRATORY_TRACT | 3 refills | Status: AC

## 2024-07-18 NOTE — Progress Notes (Signed)
 07/18/2024 Name: Stephen Savage MRN: [id number] DOB: January 24, 1941  Chief Complaint  Patient presents with   Medication Assistance    Stephen Savage is a 83 y.o. year old male who presented for a telephone visit.   They were referred to the pharmacist by their PCP for assistance in managing diabetes and medication access.    Also reach patient's daughter, Stephen Savage (listed on DPR), by telephone today as requested by patient  Subjective:   Care Team: Primary Care Provider: [name], DO ; Next Scheduled Visit: 11/14/2024 Cardiologist: [name], MD  Urologist: [name], MD   Medication Access/Adherence  Current Pharmacy:  [facility] [phone] [place], [place] - [address] AT [facility] [address] [place] [address] [place] [address] Phone: [phone] Fax: [phone]  [id number] [name], [place] - [address] AT [facility] OF [address] [place] [address] Phone: [phone] Fax: [phone]  [facility] - [place], [place] - [address] [place] [address] Phone: [phone] Fax: [phone]   Patient reports affordability concerns with their medications: No Patient reports access/transportation concerns to their pharmacy: No  Patient reports adherence concerns with their medications:  No     Barriers: reports limited literacy     Diabetes:   Current medications:  - metformin  ER 500 mg - 2 tablets twice daily - Januvia  100 mg daily   Medications tried in the past: Januvia  (cost), Rybelsus  (cost)   Current glucose readings: morning fasting blood sugar readings ranging 111-120   Patient denies hypoglycemic s/sx including dizziness, shakiness, sweating.     Statin therapy: simvastatin  20 mg daily   Current Medication Access Support: Enrolled in patient assistance for Januvia  from [employer] 03/15/2024 to 08/17/2024      COPD:    Current medications: - Bevespi  - 2 puffs twice daily - albuterol  inhaler - 2 puffs every 6 hours as needed for wheezing or shortness of breath   Medications tried in the past: Symbicort  (cost)   Reports rarely needing albuterol  inhaler now that he is using Bevespi  maintenance inhaler   Current Medication Access Support: Enrolled in patient assistance from [employer] for Bevespi  inhaler through 08/17/2025  - Note patient and daughter completed re-enrollment application via [employer] Digital Assistant   Tobacco Use:  Currently smokes 1 pack/day  Denies interest in quitting at this time   Objective:  Lab Results  Component Value Date   HGBA1C 6.5 (A) 05/04/2024    Lab Results  Component Value Date   CREATININE 0.94 11/16/2023   BUN 15 11/16/2023   NA 136 11/16/2023   K 4.2 11/16/2023   CL 97 (L) 11/16/2023   CO2 28 11/16/2023    Lab Results  Component Value Date   CHOL 128 11/16/2023   HDL 50 11/16/2023   LDLCALC 55 11/16/2023   TRIG 152 (H) 11/16/2023   CHOLHDL 2.6 11/16/2023    Medications Reviewed Today     Reviewed by Stephen Savage, Stephen Savage (Pharmacist) on 07/18/24 at 1339  Med List Status: <None>   Medication Order Taking? Sig Documenting Provider Last Dose Status Informant  ACCU-CHEK GUIDE TEST test strip [id number]  Use to check blood sugar up to 2 x daily [name], DO  Active   Accu-Chek Softclix Lancets lancets [id number]  Use to check blood sugar up to 2 x daily [name], DO  Active  albuterol  (VENTOLIN  HFA) 108 (90 Base) MCG/ACT inhaler [id number] Yes Inhale 2 puffs into the lungs every 6 (six) hours as needed for wheezing or shortness of breath. [name], DO  Active   amLODipine  (NORVASC ) 10 MG tablet [id number]  Take 1 tablet (10 mg total) by mouth daily. [name], DO  Active   aspirin EC 81 MG tablet [id number]  Take 81 mg by mouth daily. Swallow whole. [provider]  Active   Blood  Glucose Monitoring Suppl (ACCU-CHEK GUIDE ME) w/Device KIT [id number]  Use to check blood sugar up to 2 x per day [name], DO  Active   Glycopyrrolate-Formoterol (BEVESPI AEROSPHERE) 9-4.8 MCG/ACT TERESE [id number] Yes Inhale 2 puffs into the lungs 2 (two) times daily. [provider]  Active   lisinopril  (ZESTRIL ) 40 MG tablet [id number]  Take 1 tablet (40 mg total) by mouth daily. Karamalegos, [name], DO  Active   metFORMIN  (GLUCOPHAGE -XR) 500 MG 24 hr tablet [id number] Yes Take 2 tablets (1,000 mg total) by mouth 2 (two) times daily with a meal. Karamalegos, [name], DO  Active   omeprazole  (PRILOSEC) 20 MG capsule [id number]  Take 1 capsule (20 mg total) by mouth daily before breakfast. [name], DO  Active   ondansetron  (ZOFRAN -ODT) 4 MG disintegrating tablet [id number]  Take 1 tablet (4 mg total) by mouth every 8 (eight) hours as needed for nausea or vomiting. Menshew, Candida LULLA Kings, PA-C  Active   simvastatin  (ZOCOR ) 20 MG tablet [id number]  Take 1 tablet (20 mg total) by mouth at bedtime. [name], DO  Active   sitaGLIPtin  (JANUVIA ) 100 MG tablet [id number] Yes Take 1 tablet (100 mg total) by mouth daily. [name], DO  Active               Assessment/Plan:   COPD: - Reviewed appropriate inhaler technique. - Patient to contact [employer] patient assistance program as needed for refills of Bevespi inhaler - Send renewal prescription for Bevespi inhaler to [facility] (dispensing pharmacy for [employer] patient assistance program)   Diabetes: - Currently controlled - Have reviewed dietary modifications including importance of having regular well-balanced meals and snacks throughout the day, while controlling carbohydrate portion sizes - Patient to follow up with [employer] Patient Assistance Program as needed for refills of Januvia  - Will collaborate with PCP and CPhT to assist patient with re-enrollment in [employer] patient  assistance program for Januvia  for 2026 calendar year - Recommend to check glucose, keep log of results and have this record to review at upcoming medical appointments. Patient to contact provider office sooner if needed for readings outside of established parameters or symptoms     Follow Up Plan: Clinical Pharmacist will follow up with patient by telephone in January 2026   Stephen Savage, PharmD, Stephen Savage, CPP Clinical Pharmacist [facility] [phone]

## 2024-07-18 NOTE — Patient Instructions (Signed)
 Goals Addressed             This Visit's Progress    Pharmacy Goals       The goal A1c is less than 7%. This is the best way to reduce the risk of the long term complications of diabetes, including heart disease, kidney disease, eye disease, strokes, and nerve damage. An A1c of less than 7% corresponds with fasting sugars less than 130 and 2 hour after meal sugars less than 180.   When you need to reach out to patient assistance programs regarding refills or to find out the status of your application, you can do so by calling:   For Bevespi: Call [employer] at [phone] For Januvia : Call [employer] [phone]   Thank you!   Sharyle Sia, PharmD, JAQUELINE, CPP Clinical Pharmacist [facility] [phone]

## 2024-07-18 NOTE — Telephone Encounter (Signed)
 PAP: Application for Bevespi has been submitted to [employer] ([employer]), via fax

## 2024-07-18 NOTE — Telephone Encounter (Signed)
 PAP: Patient assistance application for Januvia through [employer] has been mailed to pt's home address on file. Provider portion of application will be faxed to provider's office.

## 2024-07-19 NOTE — Telephone Encounter (Signed)
 Received Provider portion PAP application ([employer]) Januvia .

## 2024-08-08 ENCOUNTER — Other Ambulatory Visit (HOSPITAL_COMMUNITY): Payer: Self-pay

## 2024-08-15 ENCOUNTER — Other Ambulatory Visit (HOSPITAL_COMMUNITY): Payer: Self-pay

## 2024-08-15 NOTE — Telephone Encounter (Signed)
 PAP: Application for Januvia has been submitted to [employer], via fax

## 2024-08-25 ENCOUNTER — Ambulatory Visit
Admission: RE | Admit: 2024-08-25 | Discharge: 2024-08-25 | Disposition: A | Discharge status: Home / Self Care | Source: Ambulatory Visit | Attending: Urology | Admitting: Urology

## 2024-08-25 DIAGNOSIS — N2889 Other specified disorders of kidney and ureter: Secondary | ICD-10-CM | POA: Diagnosis present

## 2024-08-25 MED ORDER — GADOBUTROL 1 MMOL/ML IV SOLN
5.0000 mL | Freq: Once | INTRAVENOUS | Status: AC | PRN
  Administered 2024-08-25: 5 mL via INTRAVENOUS

## 2024-09-01 DIAGNOSIS — N2889 Other specified disorders of kidney and ureter: Secondary | ICD-10-CM | POA: Insufficient documentation

## 2024-09-01 NOTE — Assessment & Plan Note (Addendum)
 stable 3.3 cm RUP enhancing mass, c/w RCC (from ~3.2 cm last year)  - on AS since 2023  Reviewed recent MR imaging which recharacterized a stable right upper pole renal mass.  Mass is similar in character and size to comparison film last year, measuring roughly the same / ~1 mm growth.  No new lesions.  This is all very reassuring.  Fully recommend continuation of active surveillance for this mass while he is in reasonable health.  Alternatively, he may also consider transitioning to a watchful waiting only as this mass carries a low likelihood of affecting him in his lifetime.   - RTC in 1 year for general checkup - Interval MRI in 2 years, ~Jan 2028 (patient preference)

## 2024-09-01 NOTE — Telephone Encounter (Signed)
 PAP: Patient assistance application for Bevespi  has been approved by PAP Companies: [employer] from 08/18/2024 to 08/17/2025. Medication should be delivered to PAP Delivery: Home. For further shipping updates, please contact [employer] ([employer]) at [phone]. Patient ID is: not provided.

## 2024-09-01 NOTE — Progress Notes (Signed)
" ° °  09/08/2024 9:42 AM   Stephen Savage October 19, 1940 [id number]  Reason for visit: Follow up Right renal mass   HPI: 84 y.o. male, initial follow up with me today, previously seen by Dr. Penne in Jan 2025  First time meeting, very pleasant gentleman No interval GU issues Denies flank pain, gross hematuria  Prior HPI: Hx of Right renal mass  - diagnosed 2023  - 2.9 cm RUP enhancing mass  - MRI abd (08/25/24) - stable 3.3 cm RUP enhancing mass, c/w RCC (from ~3.2 cm last year)      Physical Exam: BP 118/74   Pulse (!) 109   Ht 5' 9 (1.753 m)   Wt 128 lb 12.8 oz (58.4 kg)   BMI 19.02 kg/m    Constitutional:  Alert and oriented, No acute distress.  Laboratory Data: N/A  Pertinent Imaging: I have personally viewed and interpreted the MRI abd (08/25/24) - stable 3.3 cm RUP enhancing mass, c/w RCC (from ~3.2 cm last year). Agree with read, stable character and size from comparison film in 2024. Additional Right simple benign inter pole cyst.  Morphologically normal contralateral left kidney.    Assessment & Plan:    Renal mass Assessment & Plan: stable 3.3 cm RUP enhancing mass, c/w RCC (from ~3.2 cm last year)  - on AS since 2023  Reviewed recent MR imaging which recharacterized a stable right upper pole renal mass.  Mass is similar in character and size to comparison film last year, measuring roughly the same / ~1 mm growth.  No new lesions.  This is all very reassuring.  Fully recommend continuation of active surveillance for this mass while he is in reasonable health.  Alternatively, he may also consider transitioning to a watchful waiting only as this mass carries a low likelihood of affecting him in his lifetime.   - RTC in 1 year for general checkup - Interval MRI in 2 years, ~Jan 2028 (patient preference)   Umbilical hernia without obstruction and without gangrene Assessment & Plan: Very small umbilical hernia, generally asymptomatic He wanted me to examine  today  Does not seem to bother him, no pain Does not necessarily need repair Let him know that I be happy to refer him to general surgery otherwise        Penne JONELLE Skye, MD  [facility] [address] [place], [place] [address] [phone] "

## 2024-09-06 ENCOUNTER — Ambulatory Visit: Payer: Self-pay | Admitting: Urology

## 2024-09-08 ENCOUNTER — Ambulatory Visit: Admitting: Urology

## 2024-09-08 VITALS — BP 118/74 | HR 109 | Ht 69.0 in | Wt 128.8 lb

## 2024-09-08 DIAGNOSIS — N2889 Other specified disorders of kidney and ureter: Secondary | ICD-10-CM | POA: Diagnosis not present

## 2024-09-08 DIAGNOSIS — K429 Umbilical hernia without obstruction or gangrene: Secondary | ICD-10-CM | POA: Diagnosis not present

## 2024-09-08 NOTE — Assessment & Plan Note (Signed)
 Very small umbilical hernia, generally asymptomatic He wanted me to examine today  Does not seem to bother him, no pain Does not necessarily need repair Let him know that I be happy to refer him to general surgery otherwise

## 2024-09-15 ENCOUNTER — Other Ambulatory Visit: Payer: Self-pay | Admitting: Family Medicine

## 2024-09-15 DIAGNOSIS — E1169 Type 2 diabetes mellitus with other specified complication: Secondary | ICD-10-CM

## 2024-09-15 NOTE — Telephone Encounter (Signed)
 Copied from CRM #[id number]. Topic: Clinical - Medication Refill >> Sep 15, 2024 10:00 AM Amber H wrote: Medication: sitaGLIPtin  (JANUVIA ) 100 MG tablet   Has the patient contacted their pharmacy? Yes but has been unable to get in touch with anyone. She assumed he got automatic refills. Patient does not have any refills left.  (Agent: If no, request that the patient contact the pharmacy for the refill. If patient does not wish to contact the pharmacy document the reason why and proceed with request.) (Agent: If yes, when and what did the pharmacy advise?)  [facility] - [place], [place] - [address] [place] [address] Phone: [phone] Fax: [phone]  Is this the correct pharmacy for this prescription? Yes If no, delete pharmacy and type the correct one.   Has the prescription been filled recently? Yes  Is the patient out of the medication? No- has 6 days left.  Has the patient been seen for an appointment in the last year OR does the patient have an upcoming appointment? Yes- 05/04/2024  Can we respond through MyChart? Yes  Agent: Please be advised that Rx refills may take up to 3 business days. We ask that you follow-up with your pharmacy.

## 2024-09-16 MED ORDER — SITAGLIPTIN PHOSPHATE 100 MG PO TABS: 100.0000 mg | ORAL_TABLET | Freq: Every day | ORAL | 0 refills | Status: AC

## 2024-09-16 NOTE — Telephone Encounter (Signed)
 Requested Prescriptions  Pending Prescriptions Disp Refills   sitaGLIPtin  (JANUVIA ) 100 MG tablet 90 tablet 0    Sig: Take 1 tablet (100 mg total) by mouth daily.     Endocrinology:  Diabetes - DPP-4 Inhibitors Passed - 09/16/2024  7:53 AM      Passed - HBA1C is between 0 and 7.9 and within 180 days    Hemoglobin A1C  Date Value Ref Range Status  05/04/2024 6.5 (A) 4.0 - 5.6 % Final  09/29/2019 6.9  Final    Comment:    [facility]   Hgb A1c MFr Bld  Date Value Ref Range Status  11/16/2023 8.6 (H) <5.7 % of total Hgb Final    Comment:    For someone without known diabetes, a hemoglobin A1c value of 6.5% or greater indicates that they may have  diabetes and this should be confirmed with a follow-up  test. . For someone with known diabetes, a value <7% indicates  that their diabetes is well controlled and a value  greater than or equal to 7% indicates suboptimal  control. A1c targets should be individualized based on  duration of diabetes, age, comorbid conditions, and  other considerations. . Currently, no consensus exists regarding use of hemoglobin A1c for diagnosis of diabetes for children. .          Passed - Cr in normal range and within 360 days    Creat  Date Value Ref Range Status  11/16/2023 0.94 0.70 - 1.22 mg/dL Final   Creatinine, Urine  Date Value Ref Range Status  11/16/2023 65 20 - 320 mg/dL Final         Passed - Valid encounter within last 6 months    Recent Outpatient Visits           4 months ago Type 2 diabetes mellitus with other specified complication, without long-term current use of insulin [facility])   [place] [facility] [name], DO   7 months ago Centrilobular emphysema [facility])   [place] [facility] [name], DO   9 months ago Annual physical exam   [place] [facility] [name], DO       Future Appointments              In 11 months [place], Penne SAUNDERS, MD [facility]

## 2024-11-03 ENCOUNTER — Other Ambulatory Visit

## 2024-11-14 ENCOUNTER — Encounter: Admitting: Family Medicine

## 2025-09-08 ENCOUNTER — Ambulatory Visit: Admitting: Urology
